# Patient Record
Sex: Female | Born: 1960 | Race: White | Hispanic: No | Marital: Married | Smoking: Never smoker
Health system: Southern US, Community
[De-identification: ages and names within clinical notes are randomized; demographics above are authoritative.]

## PROBLEM LIST (undated history)

## (undated) DIAGNOSIS — E039 Hypothyroidism, unspecified: Secondary | ICD-10-CM

## (undated) DIAGNOSIS — I471 Supraventricular tachycardia, unspecified: Secondary | ICD-10-CM

## (undated) DIAGNOSIS — J302 Other seasonal allergic rhinitis: Secondary | ICD-10-CM

## (undated) DIAGNOSIS — T7840XA Allergy, unspecified, initial encounter: Secondary | ICD-10-CM

## (undated) DIAGNOSIS — H269 Unspecified cataract: Secondary | ICD-10-CM

## (undated) HISTORY — DX: Supraventricular tachycardia: I47.1

## (undated) HISTORY — DX: Hypothyroidism, unspecified: E03.9

## (undated) HISTORY — DX: Other seasonal allergic rhinitis: J30.2

## (undated) HISTORY — DX: Unspecified cataract: H26.9

## (undated) HISTORY — DX: Allergy, unspecified, initial encounter: T78.40XA

## (undated) HISTORY — DX: Supraventricular tachycardia, unspecified: I47.10

---

## 1998-08-20 ENCOUNTER — Other Ambulatory Visit: Admission: RE | Admit: 1998-08-20 | Discharge: 1998-08-20 | Payer: Self-pay | Admitting: Obstetrics & Gynecology

## 1999-09-09 ENCOUNTER — Other Ambulatory Visit: Admission: RE | Admit: 1999-09-09 | Discharge: 1999-09-09 | Payer: Self-pay | Admitting: Obstetrics and Gynecology

## 2000-06-19 ENCOUNTER — Encounter: Payer: Self-pay | Admitting: Family Medicine

## 2000-06-19 ENCOUNTER — Encounter: Admission: RE | Admit: 2000-06-19 | Discharge: 2000-06-19 | Payer: Self-pay | Admitting: Family Medicine

## 2000-09-27 ENCOUNTER — Other Ambulatory Visit: Admission: RE | Admit: 2000-09-27 | Discharge: 2000-09-27 | Payer: Self-pay | Admitting: Obstetrics and Gynecology

## 2001-01-11 ENCOUNTER — Encounter: Payer: Self-pay | Admitting: Obstetrics and Gynecology

## 2001-01-11 ENCOUNTER — Encounter: Admission: RE | Admit: 2001-01-11 | Discharge: 2001-01-11 | Payer: Self-pay | Admitting: Obstetrics and Gynecology

## 2001-06-20 ENCOUNTER — Encounter: Payer: Self-pay | Admitting: Obstetrics and Gynecology

## 2001-06-20 ENCOUNTER — Ambulatory Visit (HOSPITAL_COMMUNITY): Admission: RE | Admit: 2001-06-20 | Discharge: 2001-06-20 | Payer: Self-pay | Admitting: Obstetrics and Gynecology

## 2001-10-04 ENCOUNTER — Other Ambulatory Visit: Admission: RE | Admit: 2001-10-04 | Discharge: 2001-10-04 | Payer: Self-pay | Admitting: Obstetrics and Gynecology

## 2002-07-05 ENCOUNTER — Ambulatory Visit (HOSPITAL_COMMUNITY): Admission: RE | Admit: 2002-07-05 | Discharge: 2002-07-05 | Payer: Self-pay | Admitting: Obstetrics and Gynecology

## 2002-07-05 ENCOUNTER — Encounter: Payer: Self-pay | Admitting: Obstetrics and Gynecology

## 2002-10-16 ENCOUNTER — Other Ambulatory Visit: Admission: RE | Admit: 2002-10-16 | Discharge: 2002-10-16 | Payer: Self-pay | Admitting: Obstetrics and Gynecology

## 2003-05-24 HISTORY — PX: OTHER SURGICAL HISTORY: SHX169

## 2003-07-16 ENCOUNTER — Ambulatory Visit (HOSPITAL_COMMUNITY): Admission: RE | Admit: 2003-07-16 | Discharge: 2003-07-16 | Payer: Self-pay | Admitting: Obstetrics and Gynecology

## 2003-11-06 ENCOUNTER — Other Ambulatory Visit: Admission: RE | Admit: 2003-11-06 | Discharge: 2003-11-06 | Payer: Self-pay | Admitting: Obstetrics and Gynecology

## 2004-09-13 ENCOUNTER — Ambulatory Visit (HOSPITAL_COMMUNITY): Admission: RE | Admit: 2004-09-13 | Discharge: 2004-09-13 | Payer: Self-pay | Admitting: Obstetrics and Gynecology

## 2004-12-09 ENCOUNTER — Other Ambulatory Visit: Admission: RE | Admit: 2004-12-09 | Discharge: 2004-12-09 | Payer: Self-pay | Admitting: Obstetrics and Gynecology

## 2005-01-18 ENCOUNTER — Ambulatory Visit: Payer: Self-pay | Admitting: Family Medicine

## 2005-01-25 ENCOUNTER — Ambulatory Visit: Payer: Self-pay | Admitting: Family Medicine

## 2005-02-22 ENCOUNTER — Encounter: Admission: RE | Admit: 2005-02-22 | Discharge: 2005-02-22 | Payer: Self-pay | Admitting: Family Medicine

## 2005-05-17 ENCOUNTER — Ambulatory Visit: Payer: Self-pay | Admitting: Family Medicine

## 2005-12-14 ENCOUNTER — Ambulatory Visit (HOSPITAL_COMMUNITY): Admission: RE | Admit: 2005-12-14 | Discharge: 2005-12-14 | Payer: Self-pay | Admitting: Family Medicine

## 2006-01-25 ENCOUNTER — Ambulatory Visit: Payer: Self-pay | Admitting: Family Medicine

## 2006-01-30 ENCOUNTER — Ambulatory Visit: Payer: Self-pay | Admitting: Family Medicine

## 2006-12-18 ENCOUNTER — Ambulatory Visit (HOSPITAL_COMMUNITY): Admission: RE | Admit: 2006-12-18 | Discharge: 2006-12-18 | Payer: Self-pay | Admitting: Obstetrics and Gynecology

## 2007-01-23 DIAGNOSIS — J309 Allergic rhinitis, unspecified: Secondary | ICD-10-CM | POA: Insufficient documentation

## 2007-01-23 DIAGNOSIS — E039 Hypothyroidism, unspecified: Secondary | ICD-10-CM | POA: Insufficient documentation

## 2007-02-19 ENCOUNTER — Ambulatory Visit: Payer: Self-pay | Admitting: Family Medicine

## 2007-02-19 LAB — CONVERTED CEMR LAB
Albumin: 4.2 g/dL (ref 3.5–5.2)
Alkaline Phosphatase: 34 units/L — ABNORMAL LOW (ref 39–117)
BUN: 15 mg/dL (ref 6–23)
Basophils Absolute: 0 10*3/uL (ref 0.0–0.1)
Cholesterol: 186 mg/dL (ref 0–200)
Eosinophils Absolute: 0.1 10*3/uL (ref 0.0–0.6)
GFR calc Af Amer: 99 mL/min
GFR calc non Af Amer: 82 mL/min
HCT: 41.1 % (ref 36.0–46.0)
HDL: 68.1 mg/dL (ref >39.0)
Hemoglobin: 14.2 g/dL (ref 12.0–15.0)
LDL Cholesterol: 105 mg/dL — ABNORMAL HIGH (ref 0–99)
MCHC: 34.5 g/dL (ref 30.0–36.0)
MCV: 94.9 fL (ref 78.0–100.0)
Monocytes Absolute: 0.3 10*3/uL (ref 0.2–0.7)
Monocytes Relative: 7.8 % (ref 3.0–11.0)
Neutro Abs: 2.3 10*3/uL (ref 1.4–7.7)
Neutrophils Relative %: 58.6 % (ref 43.0–77.0)
Potassium: 4.4 meq/L (ref 3.5–5.1)
Sodium: 145 meq/L (ref 135–145)
TSH: 1.13 microintl units/mL (ref 0.35–5.50)
Total Bilirubin: 1.1 mg/dL (ref 0.3–1.2)

## 2007-02-27 ENCOUNTER — Ambulatory Visit: Payer: Self-pay | Admitting: Family Medicine

## 2007-02-27 DIAGNOSIS — R599 Enlarged lymph nodes, unspecified: Secondary | ICD-10-CM | POA: Insufficient documentation

## 2007-03-26 DIAGNOSIS — K625 Hemorrhage of anus and rectum: Secondary | ICD-10-CM | POA: Insufficient documentation

## 2007-03-27 ENCOUNTER — Ambulatory Visit: Payer: Self-pay | Admitting: Family Medicine

## 2007-03-27 DIAGNOSIS — H6120 Impacted cerumen, unspecified ear: Secondary | ICD-10-CM | POA: Insufficient documentation

## 2008-01-04 ENCOUNTER — Ambulatory Visit (HOSPITAL_COMMUNITY): Admission: RE | Admit: 2008-01-04 | Discharge: 2008-01-04 | Payer: Self-pay | Admitting: Obstetrics and Gynecology

## 2008-03-14 ENCOUNTER — Ambulatory Visit: Payer: Self-pay | Admitting: Family Medicine

## 2008-03-14 LAB — CONVERTED CEMR LAB
Blood in Urine, dipstick: NEGATIVE
Glucose, Urine, Semiquant: NEGATIVE
Specific Gravity, Urine: 1.025
WBC Urine, dipstick: NEGATIVE
pH: 5.5

## 2008-03-18 ENCOUNTER — Telehealth: Payer: Self-pay | Admitting: *Deleted

## 2008-03-18 LAB — CONVERTED CEMR LAB
Albumin: 4.1 g/dL (ref 3.5–5.2)
BUN: 18 mg/dL (ref 6–23)
Basophils Relative: 0.5 % (ref 0.0–3.0)
Cholesterol: 177 mg/dL (ref 0–200)
Creatinine, Ser: 0.8 mg/dL (ref 0.4–1.2)
Eosinophils Absolute: 0.1 10*3/uL (ref 0.0–0.7)
Eosinophils Relative: 2.2 % (ref 0.0–5.0)
GFR calc Af Amer: 99 mL/min
GFR calc non Af Amer: 82 mL/min
HCT: 41.5 % (ref 36.0–46.0)
HDL: 79 mg/dL (ref >39.0)
MCHC: 34.5 g/dL (ref 30.0–36.0)
MCV: 95.8 fL (ref 78.0–100.0)
Monocytes Absolute: 0.3 10*3/uL (ref 0.1–1.0)
Platelets: 201 10*3/uL (ref 150–400)
RBC: 4.33 M/uL (ref 3.87–5.11)
TSH: 3.97 microintl units/mL (ref 0.35–5.50)
WBC: 4.1 10*3/uL — ABNORMAL LOW (ref 4.5–10.5)

## 2008-03-21 ENCOUNTER — Ambulatory Visit: Payer: Self-pay | Admitting: Family Medicine

## 2008-03-21 DIAGNOSIS — I8390 Asymptomatic varicose veins of unspecified lower extremity: Secondary | ICD-10-CM | POA: Insufficient documentation

## 2008-03-24 ENCOUNTER — Telehealth: Payer: Self-pay | Admitting: Family Medicine

## 2008-04-14 ENCOUNTER — Ambulatory Visit: Payer: Self-pay | Admitting: Vascular Surgery

## 2008-04-16 ENCOUNTER — Ambulatory Visit: Payer: Self-pay | Admitting: Internal Medicine

## 2008-05-19 ENCOUNTER — Telehealth: Payer: Self-pay | Admitting: Family Medicine

## 2008-05-29 ENCOUNTER — Ambulatory Visit: Payer: Self-pay | Admitting: Internal Medicine

## 2008-05-29 ENCOUNTER — Encounter: Payer: Self-pay | Admitting: Internal Medicine

## 2008-06-01 ENCOUNTER — Encounter: Payer: Self-pay | Admitting: Internal Medicine

## 2008-07-08 ENCOUNTER — Ambulatory Visit: Payer: Self-pay | Admitting: Vascular Surgery

## 2008-08-11 ENCOUNTER — Ambulatory Visit: Payer: Self-pay | Admitting: Vascular Surgery

## 2008-08-18 ENCOUNTER — Ambulatory Visit: Payer: Self-pay | Admitting: Vascular Surgery

## 2008-08-20 ENCOUNTER — Telehealth: Payer: Self-pay | Admitting: Family Medicine

## 2008-08-26 ENCOUNTER — Telehealth: Payer: Self-pay | Admitting: Family Medicine

## 2008-09-23 ENCOUNTER — Ambulatory Visit: Payer: Self-pay | Admitting: Vascular Surgery

## 2008-10-02 ENCOUNTER — Ambulatory Visit: Payer: Self-pay | Admitting: Vascular Surgery

## 2008-10-21 ENCOUNTER — Telehealth: Payer: Self-pay | Admitting: Family Medicine

## 2008-12-12 ENCOUNTER — Telehealth: Payer: Self-pay | Admitting: Family Medicine

## 2009-01-14 ENCOUNTER — Ambulatory Visit (HOSPITAL_COMMUNITY): Admission: RE | Admit: 2009-01-14 | Discharge: 2009-01-14 | Payer: Self-pay | Admitting: Obstetrics and Gynecology

## 2009-04-29 ENCOUNTER — Ambulatory Visit: Payer: Self-pay | Admitting: Family Medicine

## 2009-04-29 LAB — CONVERTED CEMR LAB
Albumin: 4.5 g/dL (ref 3.5–5.2)
BUN: 13 mg/dL (ref 6–23)
Basophils Relative: 0.1 % (ref 0.0–3.0)
Bilirubin Urine: NEGATIVE
Bilirubin, Direct: 0 mg/dL (ref 0.0–0.3)
CO2: 29 meq/L (ref 19–32)
Chloride: 106 meq/L (ref 96–112)
Cholesterol: 173 mg/dL (ref 0–200)
Creatinine, Ser: 0.8 mg/dL (ref 0.4–1.2)
Eosinophils Absolute: 0.1 10*3/uL (ref 0.0–0.7)
Glucose, Urine, Semiquant: NEGATIVE
Ketones, urine, test strip: NEGATIVE
MCHC: 33.7 g/dL (ref 30.0–36.0)
MCV: 97.2 fL (ref 78.0–100.0)
Monocytes Absolute: 0.3 10*3/uL (ref 0.1–1.0)
Neutro Abs: 3.5 10*3/uL (ref 1.4–7.7)
Neutrophils Relative %: 74.5 % (ref 43.0–77.0)
RBC: 4.32 M/uL (ref 3.87–5.11)
Specific Gravity, Urine: 1.02
TSH: 2.77 microintl units/mL (ref 0.35–5.50)
Total CHOL/HDL Ratio: 3
Total Protein: 6.9 g/dL (ref 6.0–8.3)
Triglycerides: 80 mg/dL (ref 0.0–149.0)

## 2009-05-08 ENCOUNTER — Ambulatory Visit: Payer: Self-pay | Admitting: Family Medicine

## 2009-05-08 DIAGNOSIS — Z86010 Personal history of colonic polyps: Secondary | ICD-10-CM | POA: Insufficient documentation

## 2010-01-20 ENCOUNTER — Ambulatory Visit (HOSPITAL_COMMUNITY): Admission: RE | Admit: 2010-01-20 | Discharge: 2010-01-20 | Payer: Self-pay | Admitting: Obstetrics and Gynecology

## 2010-05-23 HISTORY — PX: VEIN LIGATION: SHX2652

## 2010-05-27 ENCOUNTER — Ambulatory Visit: Admit: 2010-05-27 | Payer: Self-pay | Admitting: Family Medicine

## 2010-06-09 ENCOUNTER — Ambulatory Visit
Admission: RE | Admit: 2010-06-09 | Discharge: 2010-06-09 | Payer: Self-pay | Source: Home / Self Care | Attending: Family Medicine | Admitting: Family Medicine

## 2010-06-09 ENCOUNTER — Other Ambulatory Visit: Payer: Self-pay | Admitting: Family Medicine

## 2010-06-09 LAB — CBC WITH DIFFERENTIAL/PLATELET
Basophils Absolute: 0 10*3/uL (ref 0.0–0.1)
Basophils Relative: 0.6 % (ref 0.0–3.0)
Eosinophils Absolute: 0.1 10*3/uL (ref 0.0–0.7)
Eosinophils Relative: 2.7 % (ref 0.0–5.0)
HCT: 42 % (ref 36.0–46.0)
Hemoglobin: 14.4 g/dL (ref 12.0–15.0)
Lymphocytes Relative: 37.6 % (ref 12.0–46.0)
Lymphs Abs: 1.5 10*3/uL (ref 0.7–4.0)
MCHC: 34.3 g/dL (ref 30.0–36.0)
MCV: 95.2 fl (ref 78.0–100.0)
Monocytes Absolute: 0.3 10*3/uL (ref 0.1–1.0)
Monocytes Relative: 6.5 % (ref 3.0–12.0)
Neutro Abs: 2.1 10*3/uL (ref 1.4–7.7)
Neutrophils Relative %: 52.6 % (ref 43.0–77.0)
Platelets: 245 10*3/uL (ref 150.0–400.0)
RBC: 4.42 Mil/uL (ref 3.87–5.11)
RDW: 14.4 % (ref 11.5–14.6)
WBC: 4.1 10*3/uL — ABNORMAL LOW (ref 4.5–10.5)

## 2010-06-09 LAB — LIPID PANEL
Cholesterol: 186 mg/dL (ref 0–200)
HDL: 80.1 mg/dL (ref >39.00)
LDL Cholesterol: 96 mg/dL (ref 0–99)
Total CHOL/HDL Ratio: 2
Triglycerides: 51 mg/dL (ref 0.0–149.0)
VLDL: 10.2 mg/dL (ref 0.0–40.0)

## 2010-06-09 LAB — CONVERTED CEMR LAB
Bilirubin Urine: NEGATIVE
Glucose, Urine, Semiquant: NEGATIVE
Specific Gravity, Urine: 1.02
pH: 5

## 2010-06-09 LAB — BASIC METABOLIC PANEL
BUN: 13 mg/dL (ref 6–23)
CO2: 30 mEq/L (ref 19–32)
Calcium: 9.9 mg/dL (ref 8.4–10.5)
Chloride: 105 mEq/L (ref 96–112)
Creatinine, Ser: 0.7 mg/dL (ref 0.4–1.2)
GFR: 89.61 mL/min (ref >60.00)
Glucose, Bld: 88 mg/dL (ref 70–99)
Potassium: 4.2 mEq/L (ref 3.5–5.1)
Sodium: 142 mEq/L (ref 135–145)

## 2010-06-09 LAB — HEPATIC FUNCTION PANEL
ALT: 24 U/L (ref 0–35)
AST: 28 U/L (ref 0–37)
Albumin: 4.7 g/dL (ref 3.5–5.2)
Alkaline Phosphatase: 44 U/L (ref 39–117)
Bilirubin, Direct: 0.1 mg/dL (ref 0.0–0.3)
Total Bilirubin: 0.7 mg/dL (ref 0.3–1.2)
Total Protein: 7.2 g/dL (ref 6.0–8.3)

## 2010-06-09 LAB — TSH: TSH: 2.94 u[IU]/mL (ref 0.35–5.50)

## 2010-06-14 ENCOUNTER — Ambulatory Visit
Admission: RE | Admit: 2010-06-14 | Discharge: 2010-06-14 | Payer: Self-pay | Source: Home / Self Care | Attending: Family Medicine | Admitting: Family Medicine

## 2010-06-24 NOTE — Assessment & Plan Note (Signed)
Summary: CPX/CJR/pt rescd//ccm   Vital Signs:  Patient profile:   50 year old female Height:      65 inches Weight:      116 pounds BMI:     19.37 Temp:     98.1 degrees F oral BP sitting:   120 / 78  (left arm) Cuff size:   regular  Vitals Entered By: Kern Reap CMA Duncan Dull) (June 14, 2010 3:00 PM) CC: cpx Is Patient Diabetic? No Pain Assessment Patient in pain? no        CC:  cpx.  History of Present Illness: Jordan Anderson is a 50 year old, married female, nonsmoker, perimenopausal, who comes in today for general medical examination because of a history of hypothyroidism.  She takes Synthroid 75 micrograms daily TSH level normal.  Continue above dose.  She takes Claritin and steroid nasal spray p.r.n.  Tetanus 2009 colonoscopy due to thousand 15 mammogram September 2011 normal Pap by GYN.  July 2011 normal.  Allergies (verified): No Known Drug Allergies  Past History:  Past medical, surgical, family and social histories (including risk factors) reviewed, and no changes noted (except as noted below).  Past Medical History: Reviewed history from 03/27/2007 and no changes required. Allergic rhinitis Hypothyroidism endometrial ablation childbirth x 2  Past Surgical History: Reviewed history from 01/23/2007 and no changes required. CB X2  Family History: Reviewed history from 03/21/2008 and no changes required. Family History Hypertension colon polyps  Social History: Reviewed history from 02/27/2007 and no changes required. Married Never Smoked Alcohol use-no Drug use-no Regular exercise-yes  Review of Systems      See HPI  Physical Exam  General:  Well-developed,well-nourished,in no acute distress; alert,appropriate and cooperative throughout examination Head:  Normocephalic and atraumatic without obvious abnormalities. No apparent alopecia or balding. Eyes:  No corneal or conjunctival inflammation noted. EOMI. Perrla. Funduscopic exam benign, without  hemorrhages, exudates or papilledema. Vision grossly normal. Ears:  External ear exam shows no significant lesions or deformities.  Otoscopic examination reveals clear canals, tympanic membranes are intact bilaterally without bulging, retraction, inflammation or discharge. Hearing is grossly normal bilaterally. Nose:  External nasal examination shows no deformity or inflammation. Nasal mucosa are pink and moist without lesions or exudates. Mouth:  Oral mucosa and oropharynx without lesions or exudates.  Teeth in good repair. Neck:  No deformities, masses, or tenderness noted. Chest Wall:  No deformities, masses, or tenderness noted. Breasts:  No mass, nodules, thickening, tenderness, bulging, retraction, inflamation, nipple discharge or skin changes noted.   Lungs:  Normal respiratory effort, chest expands symmetrically. Lungs are clear to auscultation, no crackles or wheezes. Heart:  Normal rate and regular rhythm. S1 and S2 normal without gallop, murmur, click, rub or other extra sounds. Abdomen:  Bowel sounds positive,abdomen soft and non-tender without masses, organomegaly or hernias noted. Msk:  No deformity or scoliosis noted of thoracic or lumbar spine.   Pulses:  R and L carotid,radial,femoral,dorsalis pedis and posterior tibial pulses are full and equal bilaterally Extremities:  No clubbing, cyanosis, edema, or deformity noted with normal full range of motion of all joints.   Neurologic:  No cranial nerve deficits noted. Station and gait are normal. Plantar reflexes are down-going bilaterally. DTRs are symmetrical throughout. Sensory, motor and coordinative functions appear intact. Skin:  Intact without suspicious lesions or rashes Cervical Nodes:  No lymphadenopathy noted Axillary Nodes:  No palpable lymphadenopathy Inguinal Nodes:  No significant adenopathy Psych:  Cognition and judgment appear intact. Alert and cooperative with normal attention span and  concentration. No apparent  delusions, illusions, hallucinations   Impression & Recommendations:  Problem # 1:  HEALTH SCREENING (ICD-V70.0) Assessment Unchanged  Orders: Prescription Created Electronically [phone])  Problem # 2:  HYPOTHYROIDISM (ICD-244.9) Assessment: Improved  Her updated medication list for this problem includes:    Synthroid 75 Mcg Tabs (Levothyroxine sodium) .Marland Kitchen... Take 1 tablet by mouth once a day  name brand only  Orders: Prescription Created Electronically [id number])  Complete Medication List: 1)  Synthroid 75 Mcg Tabs (Levothyroxine sodium) .... Take 1 tablet by mouth once a day  name brand only 2)  Claritin 10 Mg Tabs (Loratadine) .... As needed 3)  Nasonex 50 Mcg/act Susp (Mometasone furoate) .... As needed 4)  Ferrous Sulfate 300 (60 Fe) Mg/63ml Syrp (Ferrous sulfate) .... Take 1 tablet by mouth at bedtime 5)  Motrin  .Marland Kitchen.. 3 tabs two times a day  Patient Instructions: 1)  Please schedule a follow-up appointment in 1 year. 2)  Take calcium +Vitamin D daily. Prescriptions: SYNTHROID 75 MCG  TABS (LEVOTHYROXINE SODIUM) Take 1 tablet by mouth once a day  name brand only  #100 x 3   Entered and Authorized by:   Roderick Pee MD   Signed by:   Roderick Pee MD on 06/14/2010   Method used:   Electronically to        [facility]. [phone]* (retail)       [phone]       [place], [place]  [address]       Ph: [phone]       Fax: [phone]   RxID:   [id number] NASONEX 50 MCG/ACT  SUSP (MOMETASONE FUROATE) as needed  #3 x 4   Entered and Authorized by:   Roderick Pee MD   Signed by:   Roderick Pee MD on 06/14/2010   Method used:   Print then Give to Patient   RxID:   [id number] SYNTHROID 75 MCG  TABS (LEVOTHYROXINE SODIUM) Take 1 tablet by mouth once a day  name brand only  #100 x 3   Entered and Authorized by:   Roderick Pee MD   Signed by:   Roderick Pee MD on 06/14/2010   Method used:   Print then Give to Patient    RxID:   [id number] SYNTHROID 75 MCG  TABS (LEVOTHYROXINE SODIUM) Take 1 tablet by mouth once a day  name brand only  #100 x 3   Entered and Authorized by:   Roderick Pee MD   Signed by:   Roderick Pee MD on 06/14/2010   Method used:   Electronically to        [facility]. [phone]* (retail)       [phone]       [place], [place]  [address]       Ph: [phone]       Fax: [phone]   RxID:   [id number]    Orders Added: 1)  Prescription Created Electronically [[id number]] 2)  Est. Patient 40-64 years [phone]

## 2010-07-28 ENCOUNTER — Telehealth: Payer: Self-pay | Admitting: Family Medicine

## 2010-07-28 NOTE — Telephone Encounter (Signed)
Pt was given a Rx for synthroid but it was signed on the line for generic... However, the pt can't take generic and has to have brand name...Marland KitchenMarland Kitchen Pt req that refill rx for Synthroid be prepared (written / paper Rx) and he will take care of getting it filled.... 90-day supply... pts # [phone].

## 2010-07-29 MED ORDER — SYNTHROID 75 MCG PO TABS: 75.0000 ug | ORAL_TABLET | Freq: Every day | ORAL | Status: DC

## 2010-10-05 NOTE — Assessment & Plan Note (Signed)
OFFICE VISIT   Jordan, Anderson  DOB:  [birth year]                                       08/18/2008  AOZHY#:[id number]   The patient returns today 1 week post laser ablation of her left great  saphenous vein with multiple stab phlebectomies.  She has had some mild  discomfort in the left thigh since her procedure with some aching near  the ablation site.  She had some moderate ecchymosis throughout the  midportion of the left thigh.  She had no distal edema and no pain at  the stab phlebectomy sites in the calf and distal thigh.   On exam today, blood pressure is 108/70, heart rate 68.  There is no  distal edema in the left leg.  There is moderate ecchymosis and bruising  in the left mid thigh which is mildly tender.  Stab wounds are all  healed nicely.  Venous duplex exam reveals no evidence of deep venous  obstruction total and occlusion left greater saphenous vein from the  ablation site to the saphenofemoral junction.   She was reassured regarding these findings.  She will wear her elastic  stocking one more week and return to see [place] on a p.r.n. basis.   Quita Skye Hart [place], M.D.  Electronically Signed   JDL/MEDQ  D:  08/18/2008  T:  08/19/2008  Job:  [id number]   cc:   Tinnie Gens A. Tawanna Cooler, MD

## 2010-10-05 NOTE — Procedures (Signed)
DUPLEX DEEP VENOUS EXAM - LOWER EXTREMITY   INDICATION:  Follow up left greater saphenous vein ablation.   HISTORY:  Edema:  Yes.  Trauma/Surgery:  Yes.  Pain:  Mild.  PE:  No.  Previous DVT:  No.  Anticoagulants:  Other:   DUPLEX EXAM:                CFV   SFV   PopV  PTV    GSV                R  L  R  L  R  L  R   L  R  L  Thrombosis    o  o     o     o      o     +  Spontaneous   +  +     +     +      +     0  Phasic        +  +     +     +      +     0  Augmentation  +  +     +     +      +     0  Compressible  +  +     +     +      +     0  Competent     +  +     +     +      +     0   Legend:  + - yes  o - no  p - partial  D - decreased   IMPRESSION:  1. No evidence of deep venous thrombosis noted in the left leg.  2. The left greater saphenous vein appears ablated from the      saphenofemoral junction to the distal thigh.    _____________________________  Quita Skye. Hart [place], M.D.   MG/MEDQ  D:  08/18/2008  T:  08/18/2008  Job:  [id number]

## 2010-10-05 NOTE — Assessment & Plan Note (Signed)
OFFICE VISIT   Jordan Anderson, Jordan Anderson  DOB:  June 27, 1960                                       07/08/2008  ZOXWR#:[id number]   The patient returns today for further evaluation of her severe venous  insufficiency of the left leg.  She has painful varicosities in her mid  thigh and mid to posterior calf area which has been present for many  years and are becoming increasingly symptomatic.  She has throbbing,  burning, aching and stinging discomfort which is aggravated by the more  she is on her feet.  She has tried long leg elastic compression  stockings as well as ibuprofen and elevation over the last 3 months with  no improvement in her symptomatology.  It is definitely affecting her  daily living and ability to work around the house and be active.   She has documented gross reflux of the left saphenofemoral junction in  the great saphenous vein which connects with these painful varicosities  and would be a good candidate for laser ablation of the left great  saphenous vein with multiple stab phlebectomies.  I think we should  proceed with this to relieve the symptoms in this female with painful  varicosities.   Quita Skye Hart [place], M.D.  Electronically Signed   JDL/MEDQ  D:  07/08/2008  T:  07/09/2008  Job:  [id number]

## 2010-10-05 NOTE — Consult Note (Signed)
VASCULAR SURGERY CONSULTATION   Jordan Anderson, Jordan Anderson  DOB:  Aug 06, 1960                                       04/14/2008  ZOXWR#:[id number]   The patient was referred for vascular surgery consultation for venous  insufficiency by Dr. Alonza Smoker.  This healthy 50 year old female has an  enlarging nest of varicosities in her left mid thigh and mid and  posterior calf area which have been present over the last 15-20 years  but have been gradually enlarging and becoming more symptomatic.  She  now describes a throbbing, burning, aching pain, as if the leg is  expanding the more she is on her feet.  This is not relieved by pain  medication.  Elevation does help somewhat, although she is unable to do  that during the day on a regular basis.  She has had no bleeding,  ulceration, thrombophlebitis or deep venous thrombosis, but does note  swelling in the ankle and foot as the day progresses.  She has not worn  elastic compression stockings.   PAST MEDICAL HISTORY:  Negative for diabetes, hypertension, coronary  artery disease, CVA, hyperlipidemia, COPD or stroke.   PREVIOUS SURGERY:  Endometrial ablation.   FAMILY HISTORY:  Positive for varicose veins in her father, coronary  artery disease and stroke in a grandparent.   SOCIAL HISTORY:  She is married, has 2 children.  She does not use  tobacco and drinks occasional alcohol.   REVIEW OF SYSTEMS:  Totally unremarkable.   ALLERGIES:  None known.   MEDICATIONS:  Synthroid 0.075 one daily.   PHYSICAL EXAM:  Blood pressure 111/80, heart rate 69, respirations 14.  General:  She is a healthy-appearing middle-aged female in no apparent  distress, alert and oriented x3.  Neck:  Supple, 3+ carotid pulses  palpable.  No bruits are audible.  Neurologic:  Normal.  No palpable  adenopathy in the neck.  Chest:  Clear to auscultation.  Cardiovascular:  Regular rhythm, no murmurs.  Abdomen:  Soft, nontender with no palpable  masses.   Lower Extremity:  Reveals 3+ femoral, popliteal, dorsalis pedis  and posterior tibial pulses palpable bilaterally.  Both feet are well-  perfused with no evidence of arterial insufficiency.  Left leg has  severe venous insufficiency along the medial aspect of the thigh  extending down into the medial calf posteriorly with moderate to large  varicosities.  She has some mild edema distally.  There is no  hyperpigmentation or ulceration in the left leg.  Right leg is  unremarkable.  Venous duplex exam performed in our office today reveals  gross reflux at the left saphenofemoral junction and throughout the left  great saphenous vein with tapering in size in the mid thigh.  The deep  system is normal with no reflux.  Small saphenous vein on the left also  has no reflux.   I think she does have symptomatic venous insufficiency of the left leg  secondary to gross reflux of left great saphenous vein and  saphenofemoral junction.  We Jordan treat her with long-leg graduated  elastic compression stockings (20 mm - 30 mm gradient) as well as  elevation as much as possible and analgesics on a regular basis.  She  Jordan try ibuprofen.  She Jordan return in 3 months to see if she has  obtained symptomatic relief.  If  not, I think she would be a good  candidate for laser ablation of her left great saphenous vein with  multiple stab phlebectomies to treat these varicosities which are  affecting her daily living and are quite symptomatic.   Quita Skye Hart [place], M.D.  Electronically Signed  JDL/MEDQ  D:  04/14/2008  T:  04/15/2008  Job:  [id number]

## 2010-10-05 NOTE — Procedures (Signed)
LOWER EXTREMITY VENOUS REFLUX EXAM   INDICATION:  Bilateral lower extremity varicose veins, left worse than  right.   EXAM:  Using color-flow imaging and pulse Doppler spectral analysis, the  right and left common femoral, superficial femoral, popliteal, posterior  tibial, greater and lesser saphenous veins are evaluated.  There is no  evidence suggesting deep venous insufficiency in the right or left lower  extremity.   The left saphenofemoral junction is not competent.  The left GSV is not  competent with the caliber as described below.   The left proximal short saphenous vein demonstrates incompetency.   GSV Diameter (used if found to be incompetent only)                                            Right    Left  Proximal Greater Saphenous Vein           cm       1.0 cm  Proximal-to-mid-thigh                     cm       0.6 cm  Mid thigh                                 cm       0.3 cm  Mid-distal thigh                          cm       0.3 cm  Distal thigh                              cm       0.3 cm  Knee                                      cm       0.4 cm   IMPRESSION:  1. Left greater saphenous vein reflux is identified with the caliber      ranging from 0.3 cm to 1.0 cm knee to groin.  2. The right and left greater saphenous veins are not aneurysmal.  3. The right and left greater saphenous veins are not tortuous.  4. The deep venous system is competent.  5. The left lesser saphenous vein is not competent.   ___________________________________________  Quita Skye. Hart [place], M.D.   MC/MEDQ  D:  04/14/2008  T:  04/14/2008  Job:  [id number]

## 2010-10-22 ENCOUNTER — Ambulatory Visit (INDEPENDENT_AMBULATORY_CARE_PROVIDER_SITE_OTHER): Payer: BC Managed Care – PPO | Admitting: Family Medicine

## 2010-10-22 ENCOUNTER — Encounter: Payer: Self-pay | Admitting: Family Medicine

## 2010-10-22 VITALS — BP 100/68 | Temp 99.2°F | Ht 65.5 in | Wt 214.0 lb

## 2010-10-22 DIAGNOSIS — J329 Chronic sinusitis, unspecified: Secondary | ICD-10-CM

## 2010-10-22 MED ORDER — AZITHROMYCIN 250 MG PO TABS: ORAL_TABLET | ORAL | Status: AC

## 2010-10-22 NOTE — Progress Notes (Signed)
  Subjective:    Patient ID: Jordan Anderson, female    DOB: 1960/10/22, 50 y.o.   MRN: [id number]  HPI Here for 3 days of sinus pressure, PND, ST, and a dry cough. No fever. On Nuprin.    Review of Systems  Constitutional: Negative.   HENT: Positive for congestion, postnasal drip and sinus pressure.   Eyes: Negative.   Respiratory: Positive for cough.        Objective:   Physical Exam  Constitutional: She appears well-developed and well-nourished.  HENT:  Right Ear: External ear normal.  Left Ear: External ear normal.  Nose: Nose normal.  Mouth/Throat: Oropharynx is clear and moist. No oropharyngeal exudate.  Eyes: Conjunctivae are normal. Pupils are equal, round, and reactive to light.  Neck: No thyromegaly present.  Pulmonary/Chest: Effort normal and breath sounds normal. No respiratory distress. She has no wheezes. She has no rales. She exhibits no tenderness.  Lymphadenopathy:    She has no cervical adenopathy.          Assessment & Plan:  Rest, fluids

## 2011-01-11 ENCOUNTER — Other Ambulatory Visit (HOSPITAL_COMMUNITY): Payer: Self-pay | Admitting: Obstetrics and Gynecology

## 2011-01-11 DIAGNOSIS — Z1231 Encounter for screening mammogram for malignant neoplasm of breast: Secondary | ICD-10-CM

## 2011-01-26 ENCOUNTER — Ambulatory Visit (HOSPITAL_COMMUNITY)
Admission: RE | Admit: 2011-01-26 | Discharge: 2011-01-26 | Disposition: A | Discharge status: Home / Self Care | Payer: BC Managed Care – PPO | Source: Ambulatory Visit | Attending: Obstetrics and Gynecology | Admitting: Obstetrics and Gynecology

## 2011-01-26 DIAGNOSIS — Z1231 Encounter for screening mammogram for malignant neoplasm of breast: Secondary | ICD-10-CM | POA: Insufficient documentation

## 2011-05-27 ENCOUNTER — Ambulatory Visit (INDEPENDENT_AMBULATORY_CARE_PROVIDER_SITE_OTHER): Payer: BC Managed Care – PPO | Admitting: Internal Medicine

## 2011-05-27 DIAGNOSIS — J019 Acute sinusitis, unspecified: Secondary | ICD-10-CM | POA: Insufficient documentation

## 2011-05-27 MED ORDER — CEFUROXIME AXETIL 500 MG PO TABS: 500.0000 mg | ORAL_TABLET | Freq: Two times a day (BID) | ORAL | Status: AC

## 2011-05-27 NOTE — Assessment & Plan Note (Signed)
51 year old white female with signs and symptoms of acute sinusitis. Treat with cefuroxime 500 mg twice a day. Also treated with nasal saline as directed.  Patient advised to call office if symptoms persist or worsen.

## 2011-05-27 NOTE — Progress Notes (Signed)
  Subjective:    Patient ID: Jordan Anderson, female    DOB: 02-22-1961, 51 y.o.   MRN: [id number]  URI  This is a new problem. The current episode started in the past 7 days. The problem has been gradually worsening. The maximum temperature recorded prior to her arrival was 101 - 101.9 F. The fever has been present for 1 to 2 days. Associated symptoms include congestion, coughing and sinus pain. Pertinent negatives include no ear pain or neck pain. She has tried decongestant for the symptoms. The treatment provided no relief.   She reports yellowish sputum.   Review of Systems  HENT: Positive for congestion. Negative for ear pain and neck pain.   Respiratory: Positive for cough.        No past medical history on file.  History   Social History  . Marital Status: Married    Spouse Name: N/A    Number of Children: N/A  . Years of Education: N/A   Occupational History  . Not on file.   Social History Main Topics  . Smoking status: Never Smoker   . Smokeless tobacco: Not on file  . Alcohol Use:   . Drug Use:   . Sexually Active:    Other Topics Concern  . Not on file   Social History Narrative  . No narrative on file    No past surgical history on file.  No family history on file.  Not on File  Current Outpatient Prescriptions on File Prior to Visit  Medication Sig Dispense Refill  . SYNTHROID 75 MCG tablet Take 1 tablet (75 mcg total) by mouth daily.  100 tablet  3    BP 102/80  Temp(Src) 101.8 F (38.8 C) (Oral)  Wt 115 lb (52.164 kg)    Objective:   Physical Exam  Constitutional: She appears well-developed and well-nourished.  HENT:  Head: Normocephalic and atraumatic.  Right Ear: External ear normal.  Left Ear: External ear normal.  Mouth/Throat: No oropharyngeal exudate.       Oropharyngeal erythema  Neck: Neck supple.  Cardiovascular: Normal rate, regular rhythm and normal heart sounds.   Pulmonary/Chest: Effort normal. No respiratory  distress. She has no wheezes.  Lymphadenopathy:    She has no cervical adenopathy.  Skin: Skin is warm and dry.       Assessment & Plan:

## 2011-05-27 NOTE — Patient Instructions (Signed)
Use nasal saline as directed Use mucinex and gargle with warm salt water Please call our office if your symptoms do not improve or gets worse.

## 2011-07-15 ENCOUNTER — Encounter: Payer: Self-pay | Admitting: Family

## 2011-07-15 ENCOUNTER — Ambulatory Visit (INDEPENDENT_AMBULATORY_CARE_PROVIDER_SITE_OTHER): Payer: BC Managed Care – PPO | Admitting: Family

## 2011-07-15 VITALS — BP 104/80 | Temp 98.8°F | Ht 65.5 in | Wt 116.0 lb

## 2011-07-15 DIAGNOSIS — J309 Allergic rhinitis, unspecified: Secondary | ICD-10-CM

## 2011-07-15 DIAGNOSIS — J040 Acute laryngitis: Secondary | ICD-10-CM

## 2011-07-15 DIAGNOSIS — R059 Cough, unspecified: Secondary | ICD-10-CM

## 2011-07-15 DIAGNOSIS — R05 Cough: Secondary | ICD-10-CM

## 2011-07-15 MED ORDER — METHYLPREDNISOLONE ACETATE 80 MG/ML IJ SUSP
80.0000 mg | Freq: Once | INTRAMUSCULAR | Status: AC
  Administered 2011-07-15: 80 mg via INTRAMUSCULAR

## 2011-07-15 MED ORDER — FEXOFENADINE-PSEUDOEPHED ER 180-240 MG PO TB24: 1.0000 | ORAL_TABLET | Freq: Every day | ORAL | Status: AC

## 2011-07-15 MED ORDER — METHYLPREDNISOLONE ACETATE 80 MG/ML IJ SUSP: 80.0000 mg | Freq: Once | INTRAMUSCULAR | Status: DC

## 2011-07-15 NOTE — Progress Notes (Signed)
  Subjective:    Patient ID: Jordan Anderson, female    DOB: May 14, 1961, 51 y.o.   MRN: [id number]  HPI Comments: 51 year old white female, nonsmoker, patient of Dr. Tawanna Cooler his in today complaining of sinus drainage, sore throat and hoarseness have been going on for 2 days. She's been taken over-the-counter Sudafed with little to no relief. She denies any muscle aches or pain, no fever, lightheadedness, dizziness, chest pain, palpitations, nausea or vomiting.     Review of Systems  Constitutional: Negative.   HENT: Positive for congestion, sore throat, voice change and postnasal drip.   Eyes: Negative.   Respiratory: Positive for cough.   Cardiovascular: Negative.   Musculoskeletal: Negative.   Skin: Negative.   Neurological: Negative.   Hematological: Negative.        Objective:   Physical Exam  Constitutional: She is oriented to person, place, and time. She appears well-developed and well-nourished.  HENT:  Right Ear: External ear normal.  Left Ear: External ear normal.  Nose: Nose normal.       Pharynx slightly red  Neck: Normal range of motion. Neck supple.  Cardiovascular: Normal rate, regular rhythm and normal heart sounds.   Pulmonary/Chest: Effort normal and breath sounds normal.  Musculoskeletal: Normal range of motion.  Neurological: She is alert and oriented to person, place, and time.  Skin: Skin is warm and dry.  Psychiatric: She has a normal mood and affect.          Assessment & Plan:  Assessment: Laryngitis, allergic rhinitis, cough  Plan: Depo-Medrol 80 mg IM x1 given. Allegra D 24 hours once daily given. Rest. Drink plenty of fluids. Call the office if symptoms worsen or persist. Recheck a schedule, and when necessary.

## 2011-07-18 ENCOUNTER — Ambulatory Visit (INDEPENDENT_AMBULATORY_CARE_PROVIDER_SITE_OTHER): Payer: BC Managed Care – PPO | Admitting: Family Medicine

## 2011-07-18 ENCOUNTER — Encounter: Payer: Self-pay | Admitting: Family Medicine

## 2011-07-18 VITALS — BP 124/78 | Temp 98.1°F

## 2011-07-18 DIAGNOSIS — J069 Acute upper respiratory infection, unspecified: Secondary | ICD-10-CM | POA: Insufficient documentation

## 2011-07-18 MED ORDER — PREDNISONE 20 MG PO TABS: ORAL_TABLET | ORAL | Status: DC

## 2011-07-18 MED ORDER — HYDROCODONE-HOMATROPINE 5-1.5 MG/5ML PO SYRP: ORAL_SOLUTION | ORAL | Status: DC

## 2011-07-18 NOTE — Progress Notes (Signed)
  Subjective:    Patient ID: Jordan Anderson, female    DOB: November 17, 1960, 51 y.o.   MRN: [id number]  HPI [name] is a 51 year old married female nonsmoker who comes in today with a six-day history of head congestion sore throat postnasal drip and cough. No fever no sputum production.  She was seen here last Friday by the nurse practitioner and given a shot of steroids Review of Systems    general and ENT and pulmonary review of systems otherwise negative Objective:   Physical Exam  Well-developed well-nourished female in no acute distress HEENT negative neck was supple no adenopathy lungs are clear except for some faint late expiratory wheezing      Assessment & Plan:  Viral syndrome with reactive airway disease plan short course of oral prednisone Hydromet return when necessary

## 2011-07-18 NOTE — Patient Instructions (Signed)
Drink lots of water  Do not take any over-the-counter medications,,,,,,, no Sudafed  Vaporizer in your bedroom at night  Hydromet 1/2-1 teaspoon at bedtime when necessary  Take the prednisone as directed  Return when necessary

## 2011-08-07 ENCOUNTER — Other Ambulatory Visit: Payer: Self-pay | Admitting: Internal Medicine

## 2011-08-08 NOTE — Telephone Encounter (Signed)
Dr Todd pt. 

## 2011-11-30 ENCOUNTER — Ambulatory Visit (INDEPENDENT_AMBULATORY_CARE_PROVIDER_SITE_OTHER): Payer: BC Managed Care – PPO | Admitting: Family

## 2011-11-30 ENCOUNTER — Encounter: Payer: Self-pay | Admitting: Family

## 2011-11-30 VITALS — BP 110/78 | HR 84 | Temp 98.4°F | Wt 114.0 lb

## 2011-11-30 DIAGNOSIS — J019 Acute sinusitis, unspecified: Secondary | ICD-10-CM

## 2011-11-30 DIAGNOSIS — R0982 Postnasal drip: Secondary | ICD-10-CM

## 2011-11-30 MED ORDER — METHYLPREDNISOLONE 4 MG PO KIT: PACK | ORAL | Status: AC

## 2011-11-30 NOTE — Patient Instructions (Addendum)

## 2011-11-30 NOTE — Progress Notes (Signed)
  Subjective:    Patient ID: Jordan Anderson, female    DOB: [birth year], 51 y.o.   MRN: [id number]  Sinusitis This is a new problem. The current episode started in the past 7 days. The problem is unchanged. There has been no fever. Associated symptoms include congestion and a sore throat. Pertinent negatives include no ear pain, sinus pressure or sneezing. Past treatments include nothing.      Review of Systems  Constitutional: Negative.   HENT: Positive for congestion, sore throat, rhinorrhea and postnasal drip. Negative for ear pain, sneezing and sinus pressure.   Respiratory: Negative.   Cardiovascular: Negative.   Musculoskeletal: Negative.   Neurological: Negative.   Hematological: Negative.   Psychiatric/Behavioral: Negative.        Objective:   Physical Exam  Constitutional: She is oriented to person, place, and time. She appears well-developed and well-nourished.  HENT:  Right Ear: External ear normal.  Left Ear: External ear normal.  Mouth/Throat: Oropharynx is clear and moist.  Neck: Normal range of motion. Neck supple.  Cardiovascular: Normal rate, regular rhythm and normal heart sounds.   Pulmonary/Chest: Effort normal and breath sounds normal.  Musculoskeletal: Normal range of motion.  Neurological: She is alert and oriented to person, place, and time.  Skin: Skin is warm and dry.  Psychiatric: She has a normal mood and affect.          Assessment & Plan:  Assessment: Postnasal drip, sinusitis  Plan: Medrol Dosepak as directed. Rest. Drink plenty of fluids. Patient call the office symptoms worsen or persist. Recheck a schedule, and when necessary. ring.

## 2012-01-19 ENCOUNTER — Other Ambulatory Visit (HOSPITAL_COMMUNITY): Payer: Self-pay | Admitting: Obstetrics and Gynecology

## 2012-01-19 DIAGNOSIS — Z1231 Encounter for screening mammogram for malignant neoplasm of breast: Secondary | ICD-10-CM

## 2012-01-30 ENCOUNTER — Ambulatory Visit (HOSPITAL_COMMUNITY)
Admission: RE | Admit: 2012-01-30 | Discharge: 2012-01-30 | Disposition: A | Discharge status: Home / Self Care | Payer: BC Managed Care – PPO | Source: Ambulatory Visit | Attending: Obstetrics and Gynecology | Admitting: Obstetrics and Gynecology

## 2012-01-30 DIAGNOSIS — Z1231 Encounter for screening mammogram for malignant neoplasm of breast: Secondary | ICD-10-CM | POA: Insufficient documentation

## 2012-02-06 ENCOUNTER — Other Ambulatory Visit (INDEPENDENT_AMBULATORY_CARE_PROVIDER_SITE_OTHER): Payer: BC Managed Care – PPO

## 2012-02-06 DIAGNOSIS — Z13220 Encounter for screening for lipoid disorders: Secondary | ICD-10-CM

## 2012-02-06 DIAGNOSIS — Z0000 Encounter for general adult medical examination without abnormal findings: Secondary | ICD-10-CM

## 2012-02-06 LAB — LIPID PANEL
HDL: 84.8 mg/dL (ref >39.00)
Total CHOL/HDL Ratio: 2

## 2012-02-06 LAB — POCT URINALYSIS DIPSTICK
Ketones, UA: NEGATIVE
Leukocytes, UA: NEGATIVE
Nitrite, UA: NEGATIVE
Protein, UA: NEGATIVE
Urobilinogen, UA: 0.2
pH, UA: 7.5

## 2012-02-06 LAB — HEPATIC FUNCTION PANEL
AST: 26 U/L (ref 0–37)
Albumin: 4.5 g/dL (ref 3.5–5.2)
Alkaline Phosphatase: 38 U/L — ABNORMAL LOW (ref 39–117)
Bilirubin, Direct: 0.1 mg/dL (ref 0.0–0.3)
Total Protein: 7.3 g/dL (ref 6.0–8.3)

## 2012-02-06 LAB — CBC WITH DIFFERENTIAL/PLATELET
Basophils Absolute: 0 10*3/uL (ref 0.0–0.1)
Basophils Relative: 0.4 % (ref 0.0–3.0)
Hemoglobin: 13.9 g/dL (ref 12.0–15.0)
Lymphocytes Relative: 32.7 % (ref 12.0–46.0)
Monocytes Relative: 8.6 % (ref 3.0–12.0)
Neutro Abs: 2 10*3/uL (ref 1.4–7.7)
Neutrophils Relative %: 56.2 % (ref 43.0–77.0)
RBC: 4.4 Mil/uL (ref 3.87–5.11)

## 2012-02-06 LAB — LDL CHOLESTEROL, DIRECT: Direct LDL: 102.2 mg/dL

## 2012-02-06 LAB — BASIC METABOLIC PANEL
Calcium: 9.6 mg/dL (ref 8.4–10.5)
GFR: 83.7 mL/min (ref >60.00)
Sodium: 141 mEq/L (ref 135–145)

## 2012-02-09 ENCOUNTER — Encounter: Payer: BC Managed Care – PPO | Admitting: Family Medicine

## 2012-02-13 ENCOUNTER — Ambulatory Visit (INDEPENDENT_AMBULATORY_CARE_PROVIDER_SITE_OTHER): Payer: BC Managed Care – PPO | Admitting: Family Medicine

## 2012-02-13 ENCOUNTER — Encounter: Payer: Self-pay | Admitting: Family Medicine

## 2012-02-13 VITALS — BP 102/68 | Temp 98.5°F | Ht 66.0 in | Wt 117.0 lb

## 2012-02-13 DIAGNOSIS — E039 Hypothyroidism, unspecified: Secondary | ICD-10-CM

## 2012-02-13 DIAGNOSIS — Z780 Asymptomatic menopausal state: Secondary | ICD-10-CM

## 2012-02-13 DIAGNOSIS — J309 Allergic rhinitis, unspecified: Secondary | ICD-10-CM

## 2012-02-13 DIAGNOSIS — H6120 Impacted cerumen, unspecified ear: Secondary | ICD-10-CM

## 2012-02-13 MED ORDER — ESTROGENS, CONJUGATED 0.625 MG/GM VA CREA: TOPICAL_CREAM | Freq: Every day | VAGINAL | Status: DC

## 2012-02-13 MED ORDER — SYNTHROID 75 MCG PO TABS: 75.0000 ug | ORAL_TABLET | Freq: Every day | ORAL | Status: DC

## 2012-02-13 NOTE — Patient Instructions (Signed)
Continue the Synthroid one daily  Small amounts of hormonal cream twice weekly  Remember to do a thorough breast exam monthly  Return in one year sooner if any problems  Stretching Motrin and elevation and ice as needed for your heel pain

## 2012-02-13 NOTE — Progress Notes (Signed)
  Subjective:    Patient ID: Jordan Anderson, female    DOB: 01/22/1961, 51 y.o.   MRN: [id number]  HPI Jordan Anderson is a 51 year old married female nonsmoker who comes in today for general physical examination because of a history of allergic rhinitis and hypothyroidism.  She takes over-the-counter Allegra for allergic rhinitis and Synthroid 75 mcg daily for hypothyroidism  5 years ago she had an endometrial ablation. She did have some perimenopausal symptoms a couple years ago but they have basically gone away.. She does have vaginal dryness.  She gets routine eye care, dental care, BSE monthly, and you mammography, colonoscopy at age 51 because of colon polyps. Tetanus booster 2009 she declines a flu shot    Review of Systems  Constitutional: Negative.   HENT: Negative.   Eyes: Negative.   Respiratory: Negative.   Cardiovascular: Negative.   Gastrointestinal: Negative.   Genitourinary: Negative.   Musculoskeletal: Negative.   Neurological: Negative.   Hematological: Negative.   Psychiatric/Behavioral: Negative.        Objective:   Physical Exam  Constitutional: She appears well-developed and well-nourished.  HENT:  Head: Normocephalic and atraumatic.  Right Ear: External ear normal.  Left Ear: External ear normal.  Nose: Nose normal.  Mouth/Throat: Oropharynx is clear and moist.  Eyes: EOM are normal. Pupils are equal, round, and reactive to light.  Neck: Normal range of motion. Neck supple. No thyromegaly present.  Cardiovascular: Normal rate, regular rhythm, normal heart sounds and intact distal pulses.  Exam reveals no gallop and no friction rub.   No murmur heard. Pulmonary/Chest: Effort normal and breath sounds normal.  Abdominal: Soft. Bowel sounds are normal. She exhibits no distension and no mass. There is no tenderness. There is no rebound.  Genitourinary:       Bilateral breast exam normal  Musculoskeletal: Normal range of motion.  Lymphadenopathy:    She has no  cervical adenopathy.  Neurological: She is alert. She has normal reflexes. No cranial nerve deficit. She exhibits normal muscle tone. Coordination normal.  Skin: Skin is warm and dry.       Total body skin exam normal  Psychiatric: She has a normal mood and affect. Her behavior is normal. Judgment and thought content normal.          Assessment & Plan:  Healthy female  Hypothyroidism continue Synthroid  Allergic rhinitis continue Allegra  Postmenopausal vaginal dryness add Premarin cream  Achilles tendinitis Motrin elevation and ice

## 2012-03-14 ENCOUNTER — Other Ambulatory Visit: Payer: Self-pay | Admitting: *Deleted

## 2012-03-14 MED ORDER — FLUTICASONE PROPIONATE 50 MCG/ACT NA SUSP: 2.0000 | Freq: Every day | NASAL | Status: DC

## 2012-05-04 ENCOUNTER — Other Ambulatory Visit: Payer: Self-pay | Admitting: Family Medicine

## 2012-12-17 ENCOUNTER — Other Ambulatory Visit: Payer: Self-pay | Admitting: Family Medicine

## 2012-12-17 DIAGNOSIS — Z1231 Encounter for screening mammogram for malignant neoplasm of breast: Secondary | ICD-10-CM

## 2013-02-01 ENCOUNTER — Ambulatory Visit (HOSPITAL_COMMUNITY)
Admission: RE | Admit: 2013-02-01 | Discharge: 2013-02-01 | Disposition: A | Discharge status: Home / Self Care | Payer: BC Managed Care – PPO | Source: Ambulatory Visit | Attending: Family Medicine | Admitting: Family Medicine

## 2013-02-01 ENCOUNTER — Other Ambulatory Visit: Payer: Self-pay | Admitting: Family Medicine

## 2013-02-01 DIAGNOSIS — Z1231 Encounter for screening mammogram for malignant neoplasm of breast: Secondary | ICD-10-CM

## 2013-02-13 ENCOUNTER — Other Ambulatory Visit: Payer: BC Managed Care – PPO

## 2013-02-19 ENCOUNTER — Encounter: Payer: BC Managed Care – PPO | Admitting: Family Medicine

## 2013-03-27 ENCOUNTER — Other Ambulatory Visit (INDEPENDENT_AMBULATORY_CARE_PROVIDER_SITE_OTHER): Payer: BC Managed Care – PPO

## 2013-03-27 DIAGNOSIS — Z0000 Encounter for general adult medical examination without abnormal findings: Secondary | ICD-10-CM

## 2013-03-27 LAB — POCT URINALYSIS DIPSTICK
Bilirubin, UA: NEGATIVE
Blood, UA: NEGATIVE
Glucose, UA: NEGATIVE
Ketones, UA: NEGATIVE
Spec Grav, UA: 1.015

## 2013-03-27 LAB — LIPID PANEL
Cholesterol: 196 mg/dL (ref 0–200)
LDL Cholesterol: 115 mg/dL — ABNORMAL HIGH (ref 0–99)
Triglycerides: 43 mg/dL (ref 0.0–149.0)

## 2013-03-27 LAB — HEPATIC FUNCTION PANEL
ALT: 22 U/L (ref 0–35)
AST: 28 U/L (ref 0–37)
Alkaline Phosphatase: 39 U/L (ref 39–117)
Bilirubin, Direct: 0 mg/dL (ref 0.0–0.3)
Total Bilirubin: 0.6 mg/dL (ref 0.3–1.2)

## 2013-03-27 LAB — CBC WITH DIFFERENTIAL/PLATELET
Basophils Absolute: 0 10*3/uL (ref 0.0–0.1)
Eosinophils Absolute: 0.1 10*3/uL (ref 0.0–0.7)
Eosinophils Relative: 2.2 % (ref 0.0–5.0)
MCV: 94.3 fl (ref 78.0–100.0)
Monocytes Absolute: 0.3 10*3/uL (ref 0.1–1.0)
Neutrophils Relative %: 55.5 % (ref 43.0–77.0)
Platelets: 216 10*3/uL (ref 150.0–400.0)
WBC: 3.7 10*3/uL — ABNORMAL LOW (ref 4.5–10.5)

## 2013-03-27 LAB — BASIC METABOLIC PANEL
BUN: 22 mg/dL (ref 6–23)
Chloride: 103 mEq/L (ref 96–112)
Creatinine, Ser: 0.8 mg/dL (ref 0.4–1.2)

## 2013-04-04 ENCOUNTER — Encounter: Payer: Self-pay | Admitting: Family Medicine

## 2013-04-04 ENCOUNTER — Ambulatory Visit (INDEPENDENT_AMBULATORY_CARE_PROVIDER_SITE_OTHER): Payer: BC Managed Care – PPO | Admitting: Family Medicine

## 2013-04-04 VITALS — BP 110/70 | Temp 98.1°F | Ht 65.25 in | Wt 115.0 lb

## 2013-04-04 DIAGNOSIS — N952 Postmenopausal atrophic vaginitis: Secondary | ICD-10-CM | POA: Insufficient documentation

## 2013-04-04 DIAGNOSIS — J309 Allergic rhinitis, unspecified: Secondary | ICD-10-CM

## 2013-04-04 DIAGNOSIS — Z780 Asymptomatic menopausal state: Secondary | ICD-10-CM

## 2013-04-04 DIAGNOSIS — E039 Hypothyroidism, unspecified: Secondary | ICD-10-CM

## 2013-04-04 MED ORDER — SYNTHROID 75 MCG PO TABS: 75.0000 ug | ORAL_TABLET | Freq: Every day | ORAL | Status: DC

## 2013-04-04 MED ORDER — ESTROGENS, CONJUGATED 0.625 MG/GM VA CREA: TOPICAL_CREAM | Freq: Every day | VAGINAL | Status: DC

## 2013-04-04 MED ORDER — FLUTICASONE PROPIONATE 50 MCG/ACT NA SUSP: NASAL | Status: DC

## 2013-04-04 NOTE — Patient Instructions (Signed)
Continue the Synthroid one daily  Continue steroid nasal spray and Zyrtec 10 mg plain each bedtime   use small amounts of the hormonal cream 3 times weekly  Return in one year sooner if any problems

## 2013-04-04 NOTE — Progress Notes (Signed)
  Subjective:    Patient ID: Jordan Anderson, female    DOB: 1960/09/24, 52 y.o.   MRN: [id number]  HPI Jordan Anderson is a 52 year old married female nonsmoker who comes in today for general physical examination  She had 8 endometrial ablation about 8 years ago. Since that time she's had no periods about a year ago she was having some fairly modest hot flashes now they seem to have diminished. She does have dryness of her vagina and was given some hormonal cream which she's not used yet.  She takes Synthroid 75 mcg for hypothyroidism  She is to Flonase nasal spray for allergic rhinitis was having a lot of symptoms we'll add a plain Zyrtec  She gets routine eye care, dental care, BSE monthly, and you mammography, colonoscopy and GI, vaccinations up-to-date   Review of Systems  Constitutional: Negative.   HENT: Negative.   Eyes: Negative.   Respiratory: Negative.   Cardiovascular: Negative.   Gastrointestinal: Negative.   Endocrine: Negative.   Genitourinary: Negative.   Musculoskeletal: Negative.   Allergic/Immunologic: Negative.   Neurological: Negative.   Hematological: Negative.   Psychiatric/Behavioral: Negative.        Objective:   Physical Exam  Constitutional: She appears well-developed and well-nourished.  HENT:  Head: Normocephalic and atraumatic.  Right Ear: External ear normal.  Left Ear: External ear normal.  Nose: Nose normal.  Mouth/Throat: Oropharynx is clear and moist.  Eyes: EOM are normal. Pupils are equal, round, and reactive to light.  Neck: Normal range of motion. Neck supple. No thyromegaly present.  Cardiovascular: Normal rate, regular rhythm, normal heart sounds and intact distal pulses.  Exam reveals no gallop and no friction rub.   No murmur heard. Pulmonary/Chest: Effort normal and breath sounds normal.  Abdominal: Soft. Bowel sounds are normal. She exhibits no distension and no mass. There is no tenderness. There is no rebound.  Genitourinary:   Bilateral breast exam normal she's got history of dense breasts therefore recommend 3-D mammography in the future.  Pap year and a half ago recommended every 3 years since she is otherwise healthy and 0 risk factors  Musculoskeletal: Normal range of motion.  Lymphadenopathy:    She has no cervical adenopathy.  Neurological: She is alert. She has normal reflexes. No cranial nerve deficit. She exhibits normal muscle tone. Coordination normal.  Skin: Skin is warm and dry.  Psychiatric: She has a normal mood and affect. Her behavior is normal. Judgment and thought content normal.          Assessment & Plan:  Healthy female  Hypothyroidism continue Synthroid  Allergic rhinitis continue steroid nasal spray and OTC Zyrtec plain  Postmenopausal vaginal dryness Premarin vaginal cream

## 2013-04-22 ENCOUNTER — Ambulatory Visit (INDEPENDENT_AMBULATORY_CARE_PROVIDER_SITE_OTHER)
Admission: RE | Admit: 2013-04-22 | Discharge: 2013-04-22 | Disposition: A | Discharge status: Home / Self Care | Payer: BC Managed Care – PPO | Source: Ambulatory Visit | Attending: Family Medicine | Admitting: Family Medicine

## 2013-04-22 DIAGNOSIS — N952 Postmenopausal atrophic vaginitis: Secondary | ICD-10-CM

## 2013-04-22 DIAGNOSIS — Z780 Asymptomatic menopausal state: Secondary | ICD-10-CM

## 2013-05-01 ENCOUNTER — Encounter: Payer: Self-pay | Admitting: Internal Medicine

## 2013-05-22 ENCOUNTER — Encounter: Payer: Self-pay | Admitting: Internal Medicine

## 2013-07-12 ENCOUNTER — Encounter: Payer: Self-pay | Admitting: Internal Medicine

## 2013-07-12 ENCOUNTER — Ambulatory Visit (AMBULATORY_SURGERY_CENTER): Payer: Self-pay | Admitting: *Deleted

## 2013-07-12 VITALS — Ht 65.5 in | Wt 118.4 lb

## 2013-07-12 DIAGNOSIS — Z86010 Personal history of colonic polyps: Secondary | ICD-10-CM

## 2013-07-12 MED ORDER — MOVIPREP 100 G PO SOLR: ORAL | Status: DC

## 2013-07-12 NOTE — Progress Notes (Signed)
No allergies to eggs or soy. No problems with anesthesia.  

## 2013-07-26 ENCOUNTER — Encounter: Payer: Self-pay | Admitting: Internal Medicine

## 2013-07-26 ENCOUNTER — Ambulatory Visit (AMBULATORY_SURGERY_CENTER): Payer: BC Managed Care – PPO | Admitting: Internal Medicine

## 2013-07-26 VITALS — BP 105/75 | HR 53 | Temp 97.2°F | Resp 20 | Ht 65.5 in | Wt 118.0 lb

## 2013-07-26 DIAGNOSIS — D126 Benign neoplasm of colon, unspecified: Secondary | ICD-10-CM

## 2013-07-26 DIAGNOSIS — Z86010 Personal history of colonic polyps: Secondary | ICD-10-CM

## 2013-07-26 MED ORDER — SODIUM CHLORIDE 0.9 % IV SOLN: 500.0000 mL | INTRAVENOUS | Status: DC

## 2013-07-26 NOTE — Patient Instructions (Signed)

## 2013-07-26 NOTE — Progress Notes (Signed)
Called to room to assist during endoscopic procedure.  Patient ID and intended procedure confirmed with present staff. Received instructions for my participation in the procedure from the performing physician.  

## 2013-07-26 NOTE — Op Note (Signed)
[facility]  [address] [place], [address]   COLONOSCOPY PROCEDURE REPORT  PATIENT: Jordan Anderson, Jordan Anderson  MR#: [id number] BIRTHDATE: [birth year] , 52  yrs. old GENDER: Female ENDOSCOPIST: Lafayette Dragon, MD REFERRED IZ:[id number] Delora Fuel, M.D. PROCEDURE DATE:  07/26/2013 PROCEDURE:   Colonoscopy with cold biopsy polypectomy and Colonoscopy with snare polypectomy First Screening Colonoscopy - Avg.  risk and is 50 yrs.  old or older - No.  Prior Negative Screening - Now for repeat screening. N/A  History of Adenoma - Now for follow-up colonoscopy & has been > or = to 3 yrs.  Yes hx of adenoma.  Has been 3 or more years since last colonoscopy.  Polyps Removed Today? Yes. ASA CLASS:   Class I INDICATIONS:colonoscopy in January 2010 showed adenomatous polyp. MEDICATIONS: MAC sedation, administered by CRNA and propofol (Diprivan) 350mg  IV  DESCRIPTION OF PROCEDURE:   After the risks benefits and alternatives of the procedure were thoroughly explained, informed consent was obtained.  A digital rectal exam revealed no abnormalities of the rectum.   The LB [id number]-[id number]  endoscope was introduced through the anus and advanced to the cecum, which was identified by both the appendix and ileocecal valve. No adverse events experienced.   The quality of the prep was good, using MoviPrep  The instrument was then slowly withdrawn as the colon was fully examined.      COLON FINDINGS: Two semi-pedunculated polyps ranging between 5-13mm in size were found in the descending colon at 50  and sigmoid colon.at 20 cm.  A polypectomy was performed with cold forceps ( 50 cm) and with a cold snare.( at 20 cm)  The resection was complete and the polyp tissue was completely retrieved.  Retroflexed views revealed no abnormalities. The time to cecum=4 minutes 59 seconds. Withdrawal time=15 minutes 29 seconds.  The scope was withdrawn and the procedure completed. COMPLICATIONS: There were  no complications.  ENDOSCOPIC IMPRESSION: Two semi-pedunculated polyps ranging between 5-46mm in size were found in the descending colon and sigmoid colon; polypectomy was performed with cold forceps and with a cold snare  RECOMMENDATIONS: 1.  Await pathology results 2.  no aspirin or NSAIDs for 2 weeks High-fiber diet Recall colonoscopy pending path report   eSigned:  Lafayette Dragon, MD 07/26/2013 10:29 AM   cc:   PATIENT NAME:  Jordan Anderson, Jordan Anderson MR#: [id number]

## 2013-07-26 NOTE — Progress Notes (Signed)
Report to pacu rn, vss, bbs=clear 

## 2013-07-29 ENCOUNTER — Telehealth: Payer: Self-pay

## 2013-07-29 NOTE — Telephone Encounter (Signed)
Left a message at # [phone] for the pt to call if any questions or concerns. Maw

## 2013-07-30 ENCOUNTER — Encounter: Payer: Self-pay | Admitting: Internal Medicine

## 2013-08-01 ENCOUNTER — Encounter: Payer: Self-pay | Admitting: *Deleted

## 2013-09-23 ENCOUNTER — Ambulatory Visit (INDEPENDENT_AMBULATORY_CARE_PROVIDER_SITE_OTHER): Payer: BC Managed Care – PPO | Admitting: Family Medicine

## 2013-09-23 ENCOUNTER — Encounter: Payer: Self-pay | Admitting: Family Medicine

## 2013-09-23 VITALS — BP 140/90 | Temp 98.4°F | Wt 118.0 lb

## 2013-09-23 DIAGNOSIS — J309 Allergic rhinitis, unspecified: Secondary | ICD-10-CM

## 2013-09-23 MED ORDER — MONTELUKAST SODIUM 10 MG PO TABS: 10.0000 mg | ORAL_TABLET | Freq: Every day | ORAL | Status: DC

## 2013-09-23 NOTE — Progress Notes (Signed)
Pre visit review using our clinic review tool, if applicable. No additional management support is needed unless otherwise documented below in the visit note. 

## 2013-09-23 NOTE — Progress Notes (Signed)
   Subjective:    Patient ID: Jordan Anderson, female    DOB: 1960/06/08, 53 y.o.   MRN: [id number]  HPI [name] is a 53 year old married female nonsmoker who comes in today for a flare of her allergic rhinitis  She takes plain Zyrtec daily along with a shot of steroid nasal spray up each nostril. She plays tennis about every day. Last Thursday her symptoms became worse. She has a lot of head congestion postnasal drip. Coughing but no wheezing. No history of asthma   Review of Systems    negative review of systems Objective:   Physical Exam Well-developed well-nourished female no acute distress vital signs stable she is afebrile HEENT negative except for 3+ nasal edema neck was supple no adenopathy lungs are clear       Assessment & Plan:  Allergic rhinitis........... add Singulair.Marland Kitchen

## 2013-09-23 NOTE — Patient Instructions (Signed)
Zyrtec 10 mg plain......Marland Kitchen 1 at bedtime  Afrin nasal spray.......... 5 night limit  Steroid nasal spray  Singulair 10 mg one at bedtime

## 2013-10-08 ENCOUNTER — Ambulatory Visit (INDEPENDENT_AMBULATORY_CARE_PROVIDER_SITE_OTHER): Payer: BC Managed Care – PPO | Admitting: Family Medicine

## 2013-10-08 ENCOUNTER — Encounter: Payer: Self-pay | Admitting: Family Medicine

## 2013-10-08 VITALS — BP 110/70 | Temp 97.6°F | Wt 114.0 lb

## 2013-10-08 DIAGNOSIS — H6120 Impacted cerumen, unspecified ear: Secondary | ICD-10-CM

## 2013-10-08 NOTE — Progress Notes (Signed)
   Subjective:    Patient ID: Jordan Anderson, female    DOB: 1961-02-10, 53 y.o.   MRN: [id number]  HPI Jordan Anderson is a 53 year old female comes in today for removal of cerumen impaction in her right ear  She tried the home irrigation but it didn't work   Review of Systems Negative except for hearing loss secondary to ear wax    Objective:   Physical Exam  Well-developed well-nourished female no acute distress vital signs stable she is afebrile examination left ear normal right ear completely occluded  After irrigation wax removed no complications      Assessment & Plan:

## 2013-10-08 NOTE — Progress Notes (Signed)
Pre visit review using our clinic review tool, if applicable. No additional management support is needed unless otherwise documented below in the visit note. 

## 2013-10-08 NOTE — Patient Instructions (Signed)
Return when necessary 

## 2013-12-05 ENCOUNTER — Other Ambulatory Visit: Payer: Self-pay | Admitting: Obstetrics and Gynecology

## 2013-12-06 LAB — CYTOLOGY - PAP

## 2014-01-17 ENCOUNTER — Other Ambulatory Visit: Payer: Self-pay | Admitting: Family Medicine

## 2014-01-17 DIAGNOSIS — Z1231 Encounter for screening mammogram for malignant neoplasm of breast: Secondary | ICD-10-CM

## 2014-02-03 ENCOUNTER — Ambulatory Visit (HOSPITAL_COMMUNITY)
Admission: RE | Admit: 2014-02-03 | Discharge: 2014-02-03 | Disposition: A | Discharge status: Home / Self Care | Payer: BC Managed Care – PPO | Source: Ambulatory Visit | Attending: Family Medicine | Admitting: Family Medicine

## 2014-02-03 DIAGNOSIS — Z1231 Encounter for screening mammogram for malignant neoplasm of breast: Secondary | ICD-10-CM | POA: Diagnosis not present

## 2014-04-15 ENCOUNTER — Telehealth: Payer: Self-pay | Admitting: Family Medicine

## 2014-04-15 DIAGNOSIS — E039 Hypothyroidism, unspecified: Secondary | ICD-10-CM

## 2014-04-15 MED ORDER — SYNTHROID 75 MCG PO TABS: 75.0000 ug | ORAL_TABLET | Freq: Every day | ORAL | Status: DC

## 2014-04-15 NOTE — Telephone Encounter (Signed)
Pt has made a cpe for 12/3, but will be out of her meds in 2 days. Can you send rx SYNTHROID 75 MCG tablet [facility] [address] [facility]

## 2014-04-17 ENCOUNTER — Other Ambulatory Visit: Payer: Self-pay | Admitting: Family Medicine

## 2014-04-21 ENCOUNTER — Other Ambulatory Visit (INDEPENDENT_AMBULATORY_CARE_PROVIDER_SITE_OTHER): Payer: BC Managed Care – PPO

## 2014-04-21 DIAGNOSIS — Z0000 Encounter for general adult medical examination without abnormal findings: Secondary | ICD-10-CM

## 2014-04-21 LAB — LIPID PANEL
Cholesterol: 189 mg/dL (ref 0–200)
HDL: 68 mg/dL (ref >39.00)
LDL Cholesterol: 112 mg/dL — ABNORMAL HIGH (ref 0–99)
NonHDL: 121
Total CHOL/HDL Ratio: 3
Triglycerides: 44 mg/dL (ref 0.0–149.0)
VLDL: 8.8 mg/dL (ref 0.0–40.0)

## 2014-04-21 LAB — BASIC METABOLIC PANEL
BUN: 18 mg/dL (ref 6–23)
CHLORIDE: 105 meq/L (ref 96–112)
CO2: 24 mEq/L (ref 19–32)
CREATININE: 0.8 mg/dL (ref 0.4–1.2)
Calcium: 9.2 mg/dL (ref 8.4–10.5)
GFR: 83 mL/min (ref >60.00)
Glucose, Bld: 91 mg/dL (ref 70–99)
Potassium: 4.1 mEq/L (ref 3.5–5.1)
Sodium: 138 mEq/L (ref 135–145)

## 2014-04-21 LAB — CBC WITH DIFFERENTIAL/PLATELET
BASOS ABS: 0 10*3/uL (ref 0.0–0.1)
Basophils Relative: 0.6 % (ref 0.0–3.0)
Eosinophils Absolute: 0.1 10*3/uL (ref 0.0–0.7)
Eosinophils Relative: 1.7 % (ref 0.0–5.0)
HCT: 42 % (ref 36.0–46.0)
Hemoglobin: 14 g/dL (ref 12.0–15.0)
Lymphocytes Relative: 31.1 % (ref 12.0–46.0)
Lymphs Abs: 1.1 10*3/uL (ref 0.7–4.0)
MCHC: 33.4 g/dL (ref 30.0–36.0)
MCV: 94.7 fl (ref 78.0–100.0)
MONO ABS: 0.2 10*3/uL (ref 0.1–1.0)
Monocytes Relative: 6.7 % (ref 3.0–12.0)
Neutro Abs: 2.1 10*3/uL (ref 1.4–7.7)
Neutrophils Relative %: 59.9 % (ref 43.0–77.0)
PLATELETS: 211 10*3/uL (ref 150.0–400.0)
RBC: 4.43 Mil/uL (ref 3.87–5.11)
RDW: 14.1 % (ref 11.5–15.5)
WBC: 3.5 10*3/uL — ABNORMAL LOW (ref 4.0–10.5)

## 2014-04-21 LAB — HEPATIC FUNCTION PANEL
ALT: 17 U/L (ref 0–35)
AST: 24 U/L (ref 0–37)
Albumin: 4.6 g/dL (ref 3.5–5.2)
Alkaline Phosphatase: 41 U/L (ref 39–117)
BILIRUBIN DIRECT: 0.1 mg/dL (ref 0.0–0.3)
BILIRUBIN TOTAL: 0.6 mg/dL (ref 0.2–1.2)
TOTAL PROTEIN: 6.8 g/dL (ref 6.0–8.3)

## 2014-04-21 LAB — POCT URINALYSIS DIPSTICK
BILIRUBIN UA: NEGATIVE
GLUCOSE UA: NEGATIVE
Ketones, UA: NEGATIVE
LEUKOCYTES UA: NEGATIVE
NITRITE UA: NEGATIVE
Protein, UA: NEGATIVE
RBC UA: NEGATIVE
Spec Grav, UA: 1.02
Urobilinogen, UA: 0.2
pH, UA: 5.5

## 2014-04-21 LAB — TSH: TSH: 2.27 u[IU]/mL (ref 0.35–4.50)

## 2014-04-24 ENCOUNTER — Encounter: Payer: Self-pay | Admitting: Family Medicine

## 2014-04-24 ENCOUNTER — Ambulatory Visit (INDEPENDENT_AMBULATORY_CARE_PROVIDER_SITE_OTHER): Payer: BC Managed Care – PPO | Admitting: Family Medicine

## 2014-04-24 VITALS — BP 110/70 | Temp 98.2°F | Ht 65.25 in | Wt 116.0 lb

## 2014-04-24 DIAGNOSIS — Z780 Asymptomatic menopausal state: Secondary | ICD-10-CM

## 2014-04-24 DIAGNOSIS — Z86010 Personal history of colonic polyps: Secondary | ICD-10-CM

## 2014-04-24 DIAGNOSIS — N952 Postmenopausal atrophic vaginitis: Secondary | ICD-10-CM

## 2014-04-24 DIAGNOSIS — E039 Hypothyroidism, unspecified: Secondary | ICD-10-CM

## 2014-04-24 DIAGNOSIS — Z0000 Encounter for general adult medical examination without abnormal findings: Secondary | ICD-10-CM | POA: Insufficient documentation

## 2014-04-24 MED ORDER — SYNTHROID 75 MCG PO TABS: 75.0000 ug | ORAL_TABLET | Freq: Every day | ORAL | Status: DC

## 2014-04-24 MED ORDER — ESTROGENS, CONJUGATED 0.625 MG/GM VA CREA: TOPICAL_CREAM | Freq: Every day | VAGINAL | Status: DC

## 2014-04-24 NOTE — Progress Notes (Signed)
   Subjective:    Patient ID: Jordan Anderson, female    DOB: [birth year], 53 y.o.   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..... Nonsmoker....... Who comes in today for general physical examination  She takes Synthroid 75 g daily because of a history of hypothyroidism  She uses Premarin crit cream twice weekly for vaginal dryness. She uses Flonase and Singulair when necessary for allergic rhinitis  She gets routine eye care, dental care, BSE monthly, annual mammography...Marland KitchenMarland KitchenMarland Kitchen 3 day because of dense breasts........ She's actually had 2 colonoscopies. Family history of colon polyps. Last colonoscopy in the spring showed 2 polyps as did her first colonoscopy  She's had an endometrial ablation therefore has no periods.  Declines a flu shot   Review of Systems  Constitutional: Negative.   HENT: Negative.   Eyes: Negative.   Respiratory: Negative.   Cardiovascular: Negative.   Gastrointestinal: Negative.   Endocrine: Negative.   Genitourinary: Negative.   Musculoskeletal: Negative.   Skin: Negative.   Allergic/Immunologic: Negative.   Neurological: Negative.   Hematological: Negative.   Psychiatric/Behavioral: Negative.        Objective:   Physical Exam  Constitutional: She appears well-developed and well-nourished.  HENT:  Head: Normocephalic and atraumatic.  Right Ear: External ear normal.  Left Ear: External ear normal.  Nose: Nose normal.  Mouth/Throat: Oropharynx is clear and moist.  Eyes: EOM are normal. Pupils are equal, round, and reactive to light.  Neck: Normal range of motion. Neck supple. No JVD present. No tracheal deviation present. No thyromegaly present.  Cardiovascular: Normal rate, regular rhythm, normal heart sounds and intact distal pulses.  Exam reveals no gallop and no friction rub.   No murmur heard. Pulmonary/Chest: Effort normal and breath sounds normal. No stridor. No  respiratory distress. She has no wheezes. She has no rales. She exhibits no tenderness.  Abdominal: Soft. Bowel sounds are normal. She exhibits no distension and no mass. There is no tenderness. There is no rebound and no guarding.  Genitourinary:  Bilateral breast exam normal  Musculoskeletal: Normal range of motion.  Lymphadenopathy:    She has no cervical adenopathy.  Neurological: She is alert. She has normal reflexes. No cranial nerve deficit. She exhibits normal muscle tone. Coordination normal.  Skin: Skin is warm and dry. No rash noted. No erythema. No pallor.  Total body skin exam normal  Psychiatric: She has a normal mood and affect. Her behavior is normal. Judgment and thought content normal.          Assessment & Plan:  Healthy female  Hypothyroidism continue Synthroid  Allergic rhinitis steroid nasal spray and Singulair when necessary  History of colon polyps,,,,, 2 on her first exam to on a second exam,,,,,, follow-up with GI as outlined

## 2014-04-24 NOTE — Patient Instructions (Signed)
Continue your current medications  Follow-up in one year sooner if any problem

## 2014-04-24 NOTE — Progress Notes (Signed)
Pre visit review using our clinic review tool, if applicable. No additional management support is needed unless otherwise documented below in the visit note. 

## 2014-05-22 ENCOUNTER — Ambulatory Visit (INDEPENDENT_AMBULATORY_CARE_PROVIDER_SITE_OTHER): Payer: BC Managed Care – PPO | Admitting: Family Medicine

## 2014-05-22 ENCOUNTER — Encounter: Payer: Self-pay | Admitting: Family Medicine

## 2014-05-22 VITALS — BP 110/80 | Temp 98.1°F | Wt 111.0 lb

## 2014-05-22 DIAGNOSIS — J069 Acute upper respiratory infection, unspecified: Secondary | ICD-10-CM

## 2014-05-22 MED ORDER — HYDROCODONE-HOMATROPINE 5-1.5 MG/5ML PO SYRP: ORAL_SOLUTION | ORAL | Status: DC

## 2014-05-22 NOTE — Progress Notes (Signed)
   Subjective:    Patient ID: Jordan Anderson, female    DOB: 1961-04-09, 53 y.o.   MRN: [id number]  HPI Jordan Anderson is a 53 year old married female nonsmoker who comes in with a 2 day history of fever chills myalgias  Body temperatures up to 100.6. No earache sore throat cough nausea vomiting diarrhea urinary tract symptoms    Review of Systems Review of systems otherwise negative    Objective:   Physical Exam  Well-developed well-nourished female no acute distress vital signs stable she's afebrile HEENT were negative neck was supple no adenopathy lungs are clear      Assessment & Plan:  Viral syndrome plan treat symptomatically return.

## 2014-05-22 NOTE — Progress Notes (Signed)
Pre visit review using our clinic review tool, if applicable. No additional management support is needed unless otherwise documented below in the visit note. 

## 2014-05-22 NOTE — Patient Instructions (Signed)
Tylenol.. Or aspirin,,,,,,,,,,, 2 tabs 3 times daily when necessary  Hydromet.. 1/2-1 teaspoon at bedtime when necessary

## 2014-05-27 ENCOUNTER — Telehealth: Payer: Self-pay | Admitting: Family Medicine

## 2014-05-28 ENCOUNTER — Ambulatory Visit (INDEPENDENT_AMBULATORY_CARE_PROVIDER_SITE_OTHER): Payer: BLUE CROSS/BLUE SHIELD | Admitting: Family Medicine

## 2014-05-28 ENCOUNTER — Encounter: Payer: Self-pay | Admitting: Family Medicine

## 2014-05-28 VITALS — BP 110/70 | Temp 98.1°F | Wt 114.0 lb

## 2014-05-28 DIAGNOSIS — J069 Acute upper respiratory infection, unspecified: Secondary | ICD-10-CM

## 2014-05-28 NOTE — Patient Instructions (Signed)
Drink lots water  At bedtime Afrin nasal spray,,,,,,,,, followed by steroid nasal spray,,,,, 1 shot up each nostril,,,,, the Afrin has a 5 night limit

## 2014-05-28 NOTE — Telephone Encounter (Signed)
PLEASE NOTE: All timestamps contained within this report are represented as [place] Standard Time. CONFIDENTIALTY NOTICE: This fax transmission is intended only for the addressee. It contains information that is legally privileged, confidential or otherwise protected from use or disclosure. If you are not the intended recipient, you are strictly prohibited from reviewing, disclosing, copying using or disseminating any of this information or taking any action in reliance on or regarding this information. If you have received this fax in error, please notify us immediately by telephone so that we can arrange for its return to [place]. Phone: [phone], Toll-Free: [phone], Fax: [phone] Page: 1 of 2 Call Id: [id number] [facility] - [facility] Patient Name: Jordan Anderson Gender: Female DOB: 05-21-1961 Age: 54 Y 2 M 11 D Return Phone Number: [phone] (Primary), [phone] (Secondary) Address: [address] City/State/Zip: [place] [address] Client [facility] - Client Client Site [facility] - Day Physician [place], [place] Type Call Call Type Triage / Clinical Relationship To Patient Self Appointment Disposition EMR Appointment Scheduled Return Phone Number [phone] (Secondary) Chief Complaint Fever (non urgent symptom) Initial Comment Caller states they have a fever of 99.4 PreDisposition Did not know what to do Nurse Assessment Nurse: Loletta Specter, RN, Wells Guiles Date/Time (Eastern Time): 05/27/2014 3:18:09 PM Confirm and document reason for call. If symptomatic, describe symptoms. ---Caller states they have a fever of 99.4 since last tues and seen in office thurs. Temp has been over 100 several times. Diagnosed with viral infection. Headache, backpain, gone now, Has the patient traveled out of the country within the last 30 days? ---Not  Applicable Does the patient require triage? ---Yes Related visit to physician within the last 2 weeks? ---No Does the PT have any chronic conditions? (i.e. diabetes, asthma, etc.) ---No Did the patient indicate they were pregnant? ---No Guidelines Guideline Title Affirmed Question Affirmed Notes Nurse Date/Time (Eastern Time) Fever Fever present > 3 days (72 hours) Loletta Specter, RN, Wells Guiles 05/27/2014 3:20:23 PM Disp. Time Eilene Ghazi Time) Disposition Final User 05/27/2014 3:24:51 PM See Physician within 24 Hours Yes Loletta Specter, RN, Romualdo Bolk Understands: Yes Disagree/Comply: Comply Care Advice Given Per Guideline PLEASE NOTE: All timestamps contained within this report are represented as [place] Standard Time. CONFIDENTIALTY NOTICE: This fax transmission is intended only for the addressee. It contains information that is legally privileged, confidential or otherwise protected from use or disclosure. If you are not the intended recipient, you are strictly prohibited from reviewing, disclosing, copying using or disseminating any of this information or taking any action in reliance on or regarding this information. If you have received this fax in error, please notify us immediately by telephone so that we can arrange for its return to [place]. Phone: [phone], Toll-Free: [phone], Fax: [phone] Page: 2 of 2 Call Id: [phone] Care Advice Given Per Guideline SEE PHYSICIAN WITHIN 24 HOURS: * IF OFFICE WILL BE OPEN: You need to be examined within the next 24 hours. Call your doctor when the office opens, and make an appointment. CALL BACK IF: * You become worse. CARE ADVICE given per Fever (Adult) guideline. warm transferred to office. After Care Instructions Given Call Event Type User Date / Time Description Referrals REFERRED TO PCP OFFICE

## 2014-05-28 NOTE — Progress Notes (Signed)
Pre visit review using our clinic review tool, if applicable. No additional management support is needed unless otherwise documented below in the visit note. 

## 2014-05-28 NOTE — Progress Notes (Signed)
   Subjective:    Patient ID: Jordan Anderson, female    DOB: 03/03/1961, 54 y.o.   MRN: [id number]  HPI [name] is a 54 year old married female nonsmoker who comes in today with a nine-day history of viral syndrome manifested by a congestion  She otherwise has felt well. No fever earache sore throat cough nausea vomiting or diarrhea.   Review of Systems Review of systems otherwise negative    Objective:   Physical Exam  Well-developed well-nourished female no acute distress vital signs stable she's afebrile HEENT were negative neck was supple no adenopathy lungs are clear      Assessment & Plan:  Persistent viral syndrome........ treat symptomatically

## 2014-05-29 ENCOUNTER — Ambulatory Visit: Payer: Self-pay | Admitting: Family Medicine

## 2014-10-03 ENCOUNTER — Encounter: Payer: Self-pay | Admitting: Internal Medicine

## 2015-02-18 ENCOUNTER — Other Ambulatory Visit: Payer: Self-pay

## 2015-02-18 DIAGNOSIS — Z1231 Encounter for screening mammogram for malignant neoplasm of breast: Secondary | ICD-10-CM

## 2015-03-19 ENCOUNTER — Ambulatory Visit
Admission: RE | Admit: 2015-03-19 | Discharge: 2015-03-19 | Disposition: A | Discharge status: Home / Self Care | Payer: BLUE CROSS/BLUE SHIELD | Source: Ambulatory Visit

## 2015-03-19 DIAGNOSIS — Z1231 Encounter for screening mammogram for malignant neoplasm of breast: Secondary | ICD-10-CM

## 2015-04-03 ENCOUNTER — Other Ambulatory Visit: Payer: Self-pay | Admitting: Family Medicine

## 2015-09-29 ENCOUNTER — Other Ambulatory Visit: Payer: BLUE CROSS/BLUE SHIELD

## 2015-10-06 ENCOUNTER — Encounter: Payer: BLUE CROSS/BLUE SHIELD | Admitting: Family Medicine

## 2015-12-08 ENCOUNTER — Ambulatory Visit (INDEPENDENT_AMBULATORY_CARE_PROVIDER_SITE_OTHER): Payer: BLUE CROSS/BLUE SHIELD | Admitting: Internal Medicine

## 2015-12-08 ENCOUNTER — Telehealth: Payer: Self-pay | Admitting: Family Medicine

## 2015-12-08 ENCOUNTER — Encounter: Payer: Self-pay | Admitting: Internal Medicine

## 2015-12-08 VITALS — BP 108/80 | Temp 98.8°F | Wt 112.4 lb

## 2015-12-08 DIAGNOSIS — L089 Local infection of the skin and subcutaneous tissue, unspecified: Secondary | ICD-10-CM

## 2015-12-08 DIAGNOSIS — M7021 Olecranon bursitis, right elbow: Secondary | ICD-10-CM

## 2015-12-08 MED ORDER — DOXYCYCLINE HYCLATE 100 MG PO CAPS: 100.0000 mg | ORAL_CAPSULE | Freq: Two times a day (BID) | ORAL | Status: DC

## 2015-12-08 NOTE — Progress Notes (Signed)
Pre visit review using our clinic review tool, if applicable. No additional management support is needed unless otherwise documented below in the visit note.  Chief Complaint  Patient presents with  . Elbow Problem    Rt elbow is red, has pain and warm to touch.  First noticed on Sunday.    HPI: Jordan Anderson 55 y.o.  sda  pcp NA Onset insidious a few days ago of right elbow tenderness without specific trauma although works in the yard a lot is playing tennis. She doesn't remember an injury thought that perhaps she could have had a bug bite. She has taken some over-the-counter ibuprofen was occasional help but with slightly better yesterday but more redness and swelling today. She can move her right arm full range of motion in the fever or chills.  No hx of same  ROS: See pertinent positives and negatives per HPI.  Past Medical History  Diagnosis Date  . Hypothyroidism   . Seasonal allergies     Family History  Problem Relation Age of Onset  . Colon cancer Neg Hx     Social History   Social History  . Marital Status: Married    Spouse Name: N/A  . Number of Children: N/A  . Years of Education: N/A   Social History Main Topics  . Smoking status: Never Smoker   . Smokeless tobacco: Never Used  . Alcohol Use: 6.0 oz/week    10 Glasses of wine per week  . Drug Use: No  . Sexual Activity: Not Asked   Other Topics Concern  . None   Social History Narrative    Outpatient Prescriptions Prior to Visit  Medication Sig Dispense Refill  . Calcium Carb-Cholecalciferol (CALCIUM + D3) 600-200 MG-UNIT TABS Take by mouth daily.    Marland Kitchen conjugated estrogens (PREMARIN) vaginal cream Place vaginally daily. 42.5 g 12  . fluticasone (FLONASE) 50 MCG/ACT nasal spray 1 spray up each nostril each bedtime 16 g 6  . montelukast (SINGULAIR) 10 MG tablet Take 1 tablet (10 mg total) by mouth at bedtime. 100 tablet 3  . SYNTHROID 75 MCG tablet Take 1 tablet (75 mcg total) by mouth daily. 100  tablet 3  . SYNTHROID 75 MCG tablet TAKE 1 TABLET (75 MCG TOTAL) BY MOUTH DAILY. 100 tablet 2  . HYDROcodone-homatropine (HYCODAN) 5-1.5 MG/5ML syrup 1/2-1 teaspoon at bedtime when necessary for cough and cold (Patient not taking: Reported on 05/28/2014) 120 mL 0   No facility-administered medications prior to visit.     EXAM:  BP 108/80 mmHg  Temp(Src) 98.8 F (37.1 C) (Oral)  Wt 112 lb 6.4 oz (50.984 kg)  Body mass index is 18.57 kg/(m^2).  GENERAL: vitals reviewed and listed above, alert, oriented, appears well hydrated and in no acute distress HEENT: atraumatic, conjunctiva  clear, no obvious abnormalities on inspection of external nose and ears  Right upper extremity with normal range of motion. There is some erythema around surrounding no streaking related to the olecranon external elbow. There is some tenderness and minimal swelling at the olecranon bursa but no fluid or fluctuance noted it is warm but not hot. There are no skin lesions.  PSYCH: pleasant and cooperative, no obvious depression or anxiety  ASSESSMENT AND PLAN:  Discussed the following assessment and plan:  Bursitis of elbow, right  ? - redness around area full rom, dont feel fluctuant   fluid to tap today empiric  rx for infection and close observation   Skin infection ? -  see note.   Expectant management. If  persistent or progressive consider further eval .  -Patient advised to return or notify health care team  if symptoms worsen ,persist or new concerns arise.  Patient Instructions  joint elbow protection   Relative rest  Compresses if feels better  Antibiotic  Ibuprofen 600 - 800 mg evey 8 hours until better or 2.5 aleve twice a day  Expect significant improvement  In the next 3 days   Fu care if not improving or  Getting worse fever  Spreading   Redness and inflammation .   I can recheck area  In  Friday or next week   Or as needed.      Standley Brooking. Mekhai Venuto M.D.

## 2015-12-08 NOTE — Telephone Encounter (Signed)
[facility] Day - Client [place] [facility] Patient Name: Jordan Anderson DOB: [birth year] Initial Comment Caller states she woke up Sunday with a sore and swollen that is getting worse. She thinks it might be an insect bite. Nurse Assessment Nurse: Dimas Chyle, RN, Dellis Filbert Date/Time Eilene Ghazi Time): 12/08/2015 8:20:04 AM Confirm and document reason for call. If symptomatic, describe symptoms. You must click the next button to save text entered. ---Caller states she woke up Sunday with a sore and now swollen that is getting worse. She thinks it might be an insect bite. Bite on right arm. Has the patient traveled out of the country within the last 30 days? ---No Does the patient have any new or worsening symptoms? ---Yes Will a triage be completed? ---Yes Related visit to physician within the last 2 weeks? ---N/A Does the PT have any chronic conditions? (i.e. diabetes, asthma, etc.) ---No Is this a behavioral health or substance abuse call? ---No Guidelines Guideline Title Affirmed Question Affirmed Notes Insect Bite [1] Red or very tender (to touch) area AND [2] getting larger over 48 hours after the bite Final Disposition User See Physician within 24 Hours Dimas Chyle, RN, [employer] Referrals REFERRED TO PCP OFFICE Disagree/Comply: Leta Baptist

## 2015-12-08 NOTE — Patient Instructions (Signed)
joint elbow protection   Relative rest  Compresses if feels better  Antibiotic  Ibuprofen 600 - 800 mg evey 8 hours until better or 2.5 aleve twice a day  Expect significant improvement  In the next 3 days   Fu care if not improving or  Getting worse fever  Spreading   Redness and inflammation .   I can recheck area  In  Friday or next week   Or as needed.

## 2015-12-25 ENCOUNTER — Other Ambulatory Visit: Payer: Self-pay | Admitting: Family Medicine

## 2016-01-22 ENCOUNTER — Encounter: Payer: Self-pay | Admitting: Adult Health

## 2016-01-22 ENCOUNTER — Ambulatory Visit (INDEPENDENT_AMBULATORY_CARE_PROVIDER_SITE_OTHER): Payer: BLUE CROSS/BLUE SHIELD | Admitting: Adult Health

## 2016-01-22 ENCOUNTER — Ambulatory Visit (INDEPENDENT_AMBULATORY_CARE_PROVIDER_SITE_OTHER)
Admission: RE | Admit: 2016-01-22 | Discharge: 2016-01-22 | Disposition: A | Discharge status: Home / Self Care | Payer: BLUE CROSS/BLUE SHIELD | Source: Ambulatory Visit | Attending: Adult Health | Admitting: Adult Health

## 2016-01-22 VITALS — BP 118/64 | Temp 98.1°F | Ht 65.25 in | Wt 116.8 lb

## 2016-01-22 DIAGNOSIS — M7021 Olecranon bursitis, right elbow: Secondary | ICD-10-CM | POA: Diagnosis not present

## 2016-01-22 DIAGNOSIS — M7989 Other specified soft tissue disorders: Secondary | ICD-10-CM | POA: Diagnosis not present

## 2016-01-22 DIAGNOSIS — M25521 Pain in right elbow: Secondary | ICD-10-CM | POA: Diagnosis not present

## 2016-01-22 NOTE — Progress Notes (Signed)
Subjective:    Patient ID: Jordan Anderson, female    DOB: 09/13/1960, 55 y.o.   MRN: [id number]  HPI   55 year old female, patient of Dr. Regis Bill who presents today for follow up regarding right elbow pain. She first saw her PCP for this on 12/08/2015 at which time there was complaint of right elbow pain without trauma, there was redness and swelling noted at that time. She was started on antibiotic therapy.   Today in the office she reports that she continues to have tenderness and swelling to the right elbow. She denies any redness or warmth. She has no loss of ROM   Review of Systems  Constitutional: Negative.   Musculoskeletal: Positive for arthralgias and joint swelling.  Skin: Negative.   Neurological: Negative.   All other systems reviewed and are negative.  Past Medical History:  Diagnosis Date  . Hypothyroidism   . Seasonal allergies     Social History   Social History  . Marital status: Married    Spouse name: N/A  . Number of children: N/A  . Years of education: N/A   Occupational History  . Not on file.   Social History Main Topics  . Smoking status: Never Smoker  . Smokeless tobacco: Never Used  . Alcohol use 6.0 oz/week    10 Glasses of wine per week  . Drug use: No  . Sexual activity: Not on file   Other Topics Concern  . Not on file   Social History Narrative  . No narrative on file    Past Surgical History:  Procedure Laterality Date  . ablation uterus  2005  . VEIN LIGATION Left 2012    Family History  Problem Relation Age of Onset  . Colon cancer Neg Hx     No Known Allergies  Current Outpatient Prescriptions on File Prior to Visit  Medication Sig Dispense Refill  . Calcium Carb-Cholecalciferol (CALCIUM + D3) 600-200 MG-UNIT TABS Take by mouth daily.    Marland Kitchen conjugated estrogens (PREMARIN) vaginal cream Place vaginally daily. 42.5 g 12  . fluticasone (FLONASE) 50 MCG/ACT nasal spray 1 spray up each nostril each bedtime 16 g 6  .  montelukast (SINGULAIR) 10 MG tablet Take 1 tablet (10 mg total) by mouth at bedtime. 100 tablet 3  . SYNTHROID 75 MCG tablet Take 1 tablet (75 mcg total) by mouth daily. 100 tablet 3  . SYNTHROID 75 MCG tablet TAKE 1 TABLET (75 MCG TOTAL) BY MOUTH DAILY. 100 tablet 1   No current facility-administered medications on file prior to visit.     BP 118/64   Temp 98.1 F (36.7 C) (Oral)   Ht 5' 5.25" (1.657 m)   Wt 116 lb 12.8 oz (53 kg)   BMI 19.29 kg/m       Objective:   Physical Exam  Constitutional: She is oriented to person, place, and time. She appears well-developed and well-nourished. No distress.  Musculoskeletal: Normal range of motion. She exhibits tenderness. She exhibits no deformity.  No fluid noted over the olecranon. There does appear to be slight slight soft tissue swelling over the olecranon. Feels as a moveable cyst. No signs of infection noted  Neurological: She is alert and oriented to person, place, and time.  Skin: Skin is warm and dry. No rash noted. She is not diaphoretic. No erythema. No pallor.  Psychiatric: She has a normal mood and affect. Her behavior is normal. Judgment and thought content normal.  Nursing note  and vitals reviewed.     Assessment & Plan:  1. Olecranon bursitis of right elbow - DG Elbow Complete Right; Future - Advised Nsaids and elbow compression.  - Follow up as needed - Consider referral to ortho depending on what x rays shows.   Jordan Peng, NP

## 2016-01-26 ENCOUNTER — Telehealth: Payer: Self-pay | Admitting: Adult Health

## 2016-01-26 NOTE — Telephone Encounter (Signed)
Updated patient on results of x ray

## 2016-02-26 ENCOUNTER — Other Ambulatory Visit: Payer: Self-pay | Admitting: Family Medicine

## 2016-02-26 DIAGNOSIS — Z1231 Encounter for screening mammogram for malignant neoplasm of breast: Secondary | ICD-10-CM

## 2016-03-21 ENCOUNTER — Ambulatory Visit
Admission: RE | Admit: 2016-03-21 | Discharge: 2016-03-21 | Disposition: A | Discharge status: Home / Self Care | Payer: BLUE CROSS/BLUE SHIELD | Source: Ambulatory Visit | Attending: Family Medicine | Admitting: Family Medicine

## 2016-03-21 DIAGNOSIS — Z1231 Encounter for screening mammogram for malignant neoplasm of breast: Secondary | ICD-10-CM | POA: Diagnosis not present

## 2016-03-23 ENCOUNTER — Other Ambulatory Visit (INDEPENDENT_AMBULATORY_CARE_PROVIDER_SITE_OTHER): Payer: BLUE CROSS/BLUE SHIELD

## 2016-03-23 DIAGNOSIS — Z0000 Encounter for general adult medical examination without abnormal findings: Secondary | ICD-10-CM

## 2016-03-23 LAB — LIPID PANEL
CHOLESTEROL: 188 mg/dL (ref 0–200)
HDL: 71.8 mg/dL (ref >39.00)
LDL Cholesterol: 102 mg/dL — ABNORMAL HIGH (ref 0–99)
NonHDL: 116.41
TRIGLYCERIDES: 71 mg/dL (ref 0.0–149.0)
Total CHOL/HDL Ratio: 3
VLDL: 14.2 mg/dL (ref 0.0–40.0)

## 2016-03-23 LAB — TSH: TSH: 2.85 u[IU]/mL (ref 0.35–4.50)

## 2016-03-23 LAB — BASIC METABOLIC PANEL
BUN: 16 mg/dL (ref 6–23)
CHLORIDE: 105 meq/L (ref 96–112)
CO2: 29 meq/L (ref 19–32)
CREATININE: 0.79 mg/dL (ref 0.40–1.20)
Calcium: 9.7 mg/dL (ref 8.4–10.5)
GFR: 80.01 mL/min (ref >60.00)
Glucose, Bld: 97 mg/dL (ref 70–99)
Potassium: 4 mEq/L (ref 3.5–5.1)
Sodium: 141 mEq/L (ref 135–145)

## 2016-03-23 LAB — POC URINALSYSI DIPSTICK (AUTOMATED)
Bilirubin, UA: NEGATIVE
Blood, UA: NEGATIVE
GLUCOSE UA: NEGATIVE
Ketones, UA: NEGATIVE
LEUKOCYTES UA: NEGATIVE
NITRITE UA: NEGATIVE
Protein, UA: NEGATIVE
Spec Grav, UA: 1.02
UROBILINOGEN UA: 0.2
pH, UA: 6

## 2016-03-23 LAB — CBC WITH DIFFERENTIAL/PLATELET
BASOS PCT: 0.4 % (ref 0.0–3.0)
Basophils Absolute: 0 10*3/uL (ref 0.0–0.1)
EOS ABS: 0.1 10*3/uL (ref 0.0–0.7)
Eosinophils Relative: 2.7 % (ref 0.0–5.0)
HEMATOCRIT: 41.2 % (ref 36.0–46.0)
Hemoglobin: 14.3 g/dL (ref 12.0–15.0)
LYMPHS PCT: 34.7 % (ref 12.0–46.0)
Lymphs Abs: 1.3 10*3/uL (ref 0.7–4.0)
MCHC: 34.6 g/dL (ref 30.0–36.0)
MCV: 93.1 fl (ref 78.0–100.0)
Monocytes Absolute: 0.3 10*3/uL (ref 0.1–1.0)
Monocytes Relative: 7.8 % (ref 3.0–12.0)
NEUTROS ABS: 2 10*3/uL (ref 1.4–7.7)
Neutrophils Relative %: 54.4 % (ref 43.0–77.0)
PLATELETS: 213 10*3/uL (ref 150.0–400.0)
RBC: 4.43 Mil/uL (ref 3.87–5.11)
RDW: 13.9 % (ref 11.5–15.5)
WBC: 3.7 10*3/uL — ABNORMAL LOW (ref 4.0–10.5)

## 2016-03-23 LAB — HEPATIC FUNCTION PANEL
ALBUMIN: 4.5 g/dL (ref 3.5–5.2)
ALT: 15 U/L (ref 0–35)
AST: 18 U/L (ref 0–37)
Alkaline Phosphatase: 40 U/L (ref 39–117)
BILIRUBIN DIRECT: 0.2 mg/dL (ref 0.0–0.3)
TOTAL PROTEIN: 6.9 g/dL (ref 6.0–8.3)
Total Bilirubin: 0.8 mg/dL (ref 0.2–1.2)

## 2016-03-29 ENCOUNTER — Encounter: Payer: Self-pay | Admitting: Family Medicine

## 2016-03-29 ENCOUNTER — Ambulatory Visit (INDEPENDENT_AMBULATORY_CARE_PROVIDER_SITE_OTHER): Payer: BLUE CROSS/BLUE SHIELD | Admitting: Family Medicine

## 2016-03-29 VITALS — BP 108/70 | HR 64 | Temp 97.9°F | Ht 64.75 in | Wt 116.2 lb

## 2016-03-29 DIAGNOSIS — E039 Hypothyroidism, unspecified: Secondary | ICD-10-CM

## 2016-03-29 DIAGNOSIS — J309 Allergic rhinitis, unspecified: Secondary | ICD-10-CM | POA: Diagnosis not present

## 2016-03-29 DIAGNOSIS — Z86010 Personal history of colonic polyps: Secondary | ICD-10-CM

## 2016-03-29 DIAGNOSIS — Z780 Asymptomatic menopausal state: Secondary | ICD-10-CM

## 2016-03-29 DIAGNOSIS — Z0000 Encounter for general adult medical examination without abnormal findings: Secondary | ICD-10-CM | POA: Diagnosis not present

## 2016-03-29 DIAGNOSIS — N952 Postmenopausal atrophic vaginitis: Secondary | ICD-10-CM

## 2016-03-29 DIAGNOSIS — Z23 Encounter for immunization: Secondary | ICD-10-CM | POA: Diagnosis not present

## 2016-03-29 MED ORDER — ESTROGENS, CONJUGATED 0.625 MG/GM VA CREA: TOPICAL_CREAM | Freq: Every day | VAGINAL | 12 refills | Status: DC

## 2016-03-29 MED ORDER — MONTELUKAST SODIUM 10 MG PO TABS: 10.0000 mg | ORAL_TABLET | Freq: Every day | ORAL | 3 refills | Status: DC

## 2016-03-29 MED ORDER — FLUOCINONIDE 0.05 % EX SOLN: CUTANEOUS | 3 refills | Status: DC

## 2016-03-29 MED ORDER — LEVOTHYROXINE SODIUM 75 MCG PO TABS: ORAL_TABLET | ORAL | 4 refills | Status: DC

## 2016-03-29 MED ORDER — FLUTICASONE PROPIONATE 50 MCG/ACT NA SUSP: NASAL | 6 refills | Status: DC

## 2016-03-29 NOTE — Patient Instructions (Signed)
Price the Premarin cream in [place],,,,,,,, [email or web address]  Lidex solution,,,,,, 2 drops left ear canal bedtime when necessary  Continue other medications  Return in one year sooner if any problem

## 2016-03-29 NOTE — Progress Notes (Signed)
Jordan Anderson is a delightful 55 year old married female nonsmoker who comes in today for general physical examination  She has a history of some minor bone loss takes calcium vitamin D and plays tennis 4 days a week  He uses Premarin vaginal cream for vaginal dryness  She is Flonase for allergic rhinitis and Singulair  She takes Synthroid 75 g daily for hypothyroidism  She gets routine eye care, dental care, BSE monthly, annual mammography, colonoscopy 2015 normal except she had some polyps. She can go back every 5 years  Vaccination history tetanus 2009 seasonal flu shot given today  Last. When she was in her 21s she had an endometrial ablation. Had a Pap 2015 which was normal. Low risk therefore every 3 years.  14 point review of systems reviewed are negative except for dryness left ear canal.  Physical examination.vs  BP 108/70 (BP Location: Left Arm, Patient Position: Sitting, Cuff Size: Normal)   Pulse 64   Temp 97.9 F (36.6 C) (Oral)   Ht 5' 4.75" (1.645 m)   Wt 116 lb 3.2 oz (52.7 kg)   BMI 19.49 kg/m  HEENT were negative except for dry ear canals. Neck was supple no adenopathy thyroid normal cardiopulmonary exam normal breast exam normal abdominal exam normal pelvic and rectal deferred extremities normal skin normal peripheral pulses normal  #1 healthy female  #2 hypothyroidism continue Synthroid  #3 allergic rhinitis continue Flonase and Singulair  #4 postmenopausal vaginal dryness continue Premarin cream. #5 dry ear canals...Marland KitchenMarland KitchenMarland Kitchen steroid drops.

## 2016-03-29 NOTE — Progress Notes (Signed)
Pre visit review using our clinic review tool, if applicable. No additional management support is needed unless otherwise documented below in the visit note. 

## 2016-06-21 DIAGNOSIS — L821 Other seborrheic keratosis: Secondary | ICD-10-CM | POA: Diagnosis not present

## 2016-06-21 DIAGNOSIS — D229 Melanocytic nevi, unspecified: Secondary | ICD-10-CM | POA: Diagnosis not present

## 2016-11-25 ENCOUNTER — Ambulatory Visit (INDEPENDENT_AMBULATORY_CARE_PROVIDER_SITE_OTHER): Payer: BLUE CROSS/BLUE SHIELD | Admitting: Internal Medicine

## 2016-11-25 ENCOUNTER — Encounter: Payer: Self-pay | Admitting: Internal Medicine

## 2016-11-25 VITALS — BP 104/76 | Temp 98.5°F | Wt 113.0 lb

## 2016-11-25 DIAGNOSIS — S30861A Insect bite (nonvenomous) of abdominal wall, initial encounter: Secondary | ICD-10-CM | POA: Diagnosis not present

## 2016-11-25 DIAGNOSIS — W57XXXA Bitten or stung by nonvenomous insect and other nonvenomous arthropods, initial encounter: Secondary | ICD-10-CM | POA: Diagnosis not present

## 2016-11-25 DIAGNOSIS — R21 Rash and other nonspecific skin eruption: Secondary | ICD-10-CM

## 2016-11-25 MED ORDER — DOXYCYCLINE HYCLATE 100 MG PO TABS: 100.0000 mg | ORAL_TABLET | Freq: Two times a day (BID) | ORAL | 0 refills | Status: DC

## 2016-11-25 NOTE — Patient Instructions (Addendum)
because of the ringed rash   Will do empiric treatment  For tick  Related disease. Cover for lyme  Although usually low risk in [place]  Tick bites.   Fu if fever  Other conterns recurring rashes  With out obvious cause .     Tick Bite Information, Adult Ticks are insects that draw blood for food. Most ticks live in shrubs and grassy areas. They climb onto people and animals that brush against the leaves and grasses that they rest on. Then they bite, attaching themselves to the skin. Most ticks are harmless, but some ticks carry germs that can spread to a person through a bite and cause a disease. To reduce your risk of getting a disease from a tick bite, it is important to take steps to prevent tick bites. It is also important to check for ticks after being outdoors. If you find that a tick has attached to you, watch for symptoms of disease. How can I prevent tick bites? Take these steps to help prevent tick bites when you are outdoors in an area where ticks are found:  Use insect repellent that has DEET (20% or higher), picaridin, or IR3535 in it. Use it on: ? Skin that is showing. ? The top of your boots. ? Your pant legs. ? Your sleeve cuffs.  For repellent products that contain permethrin, follow product instructions. Use these products on: ? Clothing. ? Gear. ? Boots. ? Tents.  Wear protective clothing. Long sleeves and long pants offer the best protection from ticks.  Wear light-colored clothing so you can see ticks more easily.  Tuck your pant legs into your socks.  If you go walking on a trail, stay in the middle of the trail so your skin, hair, and clothing do not touch the bushes.  Avoid walking through areas with long grass.  Check for ticks on your clothing, hair, and skin often while you are outside, and check again before you go inside. Make sure to check the places that ticks attach themselves most often. These places include the scalp, neck, armpits, waist, groin, and  joint areas. Ticks that carry a disease called Lyme disease have to be attached to the skin for 24-48 hours. Checking for ticks every day will lessen your risk of this and other diseases.  When you come indoors, wash your clothes and take a shower or a bath right away. Dry your clothes in a dryer on high heat for at least 60 minutes. This will kill any ticks in your clothes.  What is the proper way to remove a tick? If you find a tick on your body, remove it as soon as possible. Removing a tick sooner rather than later can prevent germs from passing from the tick to your body. To remove a tick that is crawling on your skin but has not bitten:  Go outdoors and brush the tick off.  Remove the tick with tape or a lint roller.  To remove a tick that is attached to your skin:  Wash your hands.  If you have latex gloves, put them on.  Use tweezers, curved forceps, or a tick-removal tool to gently grasp the tick as close to your skin and the tick's head as possible.  Gently pull with steady, upward pressure until the tick lets go. When removing the tick: ? Take care to keep the tick's head attached to its body. ? Do not twist or jerk the tick. This can make the tick's head  or mouth break off. ? Do not squeeze or crush the tick's body. This could force disease-carrying fluids from the tick into your body.  Do not try to remove a tick with heat, alcohol, petroleum jelly, or fingernail polish. Using these methods can cause the tick to salivate and regurgitate into your bloodstream, increasing your risk of getting a disease. What should I do after removing a tick?  Clean the bite area with soap and water, rubbing alcohol, or an iodine scrub.  If an antiseptic cream or ointment is available, apply a small amount to the bite site.  Wash and disinfect any instruments that you used to remove the tick. How should I dispose of a tick? To dispose of a live tick, use one of these methods:  Place it  in rubbing alcohol.  Place it in a sealed bag or container.  Wrap it tightly in tape.  Flush it down the toilet.  Contact a health care provider if:  You have symptoms of a disease after a tick bite. Symptoms of a tick-borne disease can occur from moments after the tick bites to up to 30 days after a tick is removed. Symptoms include: ? Muscle, joint, or bone pain. ? Difficulty walking or moving your legs. ? Numbness in the legs. ? Paralysis. ? Red rash around the tick bite area that is shaped like a target or a "bull's-eye." ? Redness and swelling in the area of the tick bite. ? Fever. ? Repeated vomiting. ? Diarrhea. ? Weight loss. ? Tender, swollen lymph glands. ? Shortness of breath. ? Cough. ? Pain in the abdomen. ? Headache. ? Abnormal tiredness. ? A change in your level of consciousness. ? Confusion. Get help right away if:  You are not able to remove a tick.  A part of a tick breaks off and gets stuck in your skin.  Your symptoms get worse. Summary  Ticks may carry germs that can spread to a person through a bite and cause disease.  Wear protective clothing and use insect repellent to prevent tick bites. Follow product instructions.  If you find a tick on your body, remove it as soon as possible. If the tick is attached, do not try to remove with heat, alcohol, petroleum jelly, or fingernail polish.  Remove the attached tick using tweezers, curved forceps, or a tick-removal tool. Gently pull with steady, upward pressure until the tick lets go. Do not twist or jerk the tick. Do not squeeze or crush the tick's body.  If you have symptoms after being bitten by a tick, contact a health care provider. This information is not intended to replace advice given to you by your health care provider. Make sure you discuss any questions you have with your health care provider. Document Released: 05/06/2000 Document Revised: 02/19/2016 Document Reviewed: 02/19/2016 Elsevier  Interactive Patient Education  [employer].

## 2016-11-25 NOTE — Progress Notes (Signed)
Chief Complaint  Patient presents with  . Insect Bite    1-2 weeks ago. Right abdoment.  Itching.    HPI: Jordan Anderson 56 y.o.  PCP NA  Has history of tick bites and being outside his had a rash and itchiness around in the area on the right abdomen for about 2 weeks and it isn't going away but spreading some no systemic symptoms believe the tick bite occurred in [place] not [place] No other exposures. Uncertain how attached.  ROS: See pertinent positives and negatives per HPI. No nfever chills joint swell  Past Medical History:  Diagnosis Date  . Hypothyroidism   . Seasonal allergies     Family History  Problem Relation Age of Onset  . Colon cancer Neg Hx     Social History   Social History  . Marital status: Married    Spouse name: N/A  . Number of children: N/A  . Years of education: N/A   Social History Main Topics  . Smoking status: Never Smoker  . Smokeless tobacco: Never Used  . Alcohol use 6.0 oz/week    10 Glasses of wine per week  . Drug use: No  . Sexual activity: Not Asked   Other Topics Concern  . None   Social History Narrative  . None    Outpatient Medications Prior to Visit  Medication Sig Dispense Refill  . fluocinonide (LIDEX) 0.05 % external solution 2 drops daily at bedtime left ear canal. 60 mL 3  . fluticasone (FLONASE) 50 MCG/ACT nasal spray 1 spray up each nostril each bedtime 16 g 6  . levothyroxine (SYNTHROID) 75 MCG tablet TAKE 1 TABLET (75 MCG TOTAL) BY MOUTH DAILY. 100 tablet 4  . montelukast (SINGULAIR) 10 MG tablet Take 1 tablet (10 mg total) by mouth at bedtime. 100 tablet 3  . Calcium Carb-Cholecalciferol (CALCIUM + D3) 600-200 MG-UNIT TABS Take by mouth daily.    Marland Kitchen conjugated estrogens (PREMARIN) vaginal cream Place vaginally daily. (Patient not taking: Reported on 11/25/2016) 42.5 g 12   No facility-administered medications prior to visit.      EXAM:  BP 104/76 (BP Location: Right Arm)   Temp 98.5 F (36.9  C) (Oral)   Wt 113 lb (51.3 kg)   BMI 18.95 kg/m   Body mass index is 18.95 kg/m.  GENERAL: vitals reviewed and listed above, alert, oriented, appears well hydrated and in no acute distress HEENT: atraumatic, conjunctiva  clear, no obvious abnormalities on inspection of external nose and ears NECK: no obvious masses on inspection  Skin abdomen shows about a 5 cm roundish rash [place] and some blotchy clearing in between without vesicle. MS: moves all extremities without noticeable focal  abnormality PSYCH: pleasant and cooperative, no obvious depression or anxiety  ASSESSMENT AND PLAN:  Discussed the following assessment and plan:  Tick bite of abdomen, initial encounter  Rash - possible EM rash not certain could be stari also but no systemic sx  2 weeks out   Risk benefit of medication discussed.  -Patient advised to return or notify health care team  if symptoms worsen ,persist or new concerns arise.  Patient Instructions  because of the ringed rash   Will do empiric treatment  For tick  Related disease. Cover for lyme  Although usually low risk in [place]  Tick bites.   Fu if fever  Other conterns recurring rashes  With out obvious cause .     Tick Bite Information, Adult Ticks are  insects that draw blood for food. Most ticks live in shrubs and grassy areas. They climb onto people and animals that brush against the leaves and grasses that they rest on. Then they bite, attaching themselves to the skin. Most ticks are harmless, but some ticks carry germs that can spread to a person through a bite and cause a disease. To reduce your risk of getting a disease from a tick bite, it is important to take steps to prevent tick bites. It is also important to check for ticks after being outdoors. If you find that a tick has attached to you, watch for symptoms of disease. How can I prevent tick bites? Take these steps to help prevent tick bites when you are outdoors in an area where ticks  are found:  Use insect repellent that has DEET (20% or higher), picaridin, or IR3535 in it. Use it on: ? Skin that is showing. ? The top of your boots. ? Your pant legs. ? Your sleeve cuffs.  For repellent products that contain permethrin, follow product instructions. Use these products on: ? Clothing. ? Gear. ? Boots. ? Tents.  Wear protective clothing. Long sleeves and long pants offer the best protection from ticks.  Wear light-colored clothing so you can see ticks more easily.  Tuck your pant legs into your socks.  If you go walking on a trail, stay in the middle of the trail so your skin, hair, and clothing do not touch the bushes.  Avoid walking through areas with long grass.  Check for ticks on your clothing, hair, and skin often while you are outside, and check again before you go inside. Make sure to check the places that ticks attach themselves most often. These places include the scalp, neck, armpits, waist, groin, and joint areas. Ticks that carry a disease called Lyme disease have to be attached to the skin for 24-48 hours. Checking for ticks every day will lessen your risk of this and other diseases.  When you come indoors, wash your clothes and take a shower or a bath right away. Dry your clothes in a dryer on high heat for at least 60 minutes. This will kill any ticks in your clothes.  What is the proper way to remove a tick? If you find a tick on your body, remove it as soon as possible. Removing a tick sooner rather than later can prevent germs from passing from the tick to your body. To remove a tick that is crawling on your skin but has not bitten:  Go outdoors and brush the tick off.  Remove the tick with tape or a lint roller.  To remove a tick that is attached to your skin:  Wash your hands.  If you have latex gloves, put them on.  Use tweezers, curved forceps, or a tick-removal tool to gently grasp the tick as close to your skin and the tick's head as  possible.  Gently pull with steady, upward pressure until the tick lets go. When removing the tick: ? Take care to keep the tick's head attached to its body. ? Do not twist or jerk the tick. This can make the tick's head or mouth break off. ? Do not squeeze or crush the tick's body. This could force disease-carrying fluids from the tick into your body.  Do not try to remove a tick with heat, alcohol, petroleum jelly, or fingernail polish. Using these methods can cause the tick to salivate and regurgitate into your bloodstream, increasing  your risk of getting a disease. What should I do after removing a tick?  Clean the bite area with soap and water, rubbing alcohol, or an iodine scrub.  If an antiseptic cream or ointment is available, apply a small amount to the bite site.  Wash and disinfect any instruments that you used to remove the tick. How should I dispose of a tick? To dispose of a live tick, use one of these methods:  Place it in rubbing alcohol.  Place it in a sealed bag or container.  Wrap it tightly in tape.  Flush it down the toilet.  Contact a health care provider if:  You have symptoms of a disease after a tick bite. Symptoms of a tick-borne disease can occur from moments after the tick bites to up to 30 days after a tick is removed. Symptoms include: ? Muscle, joint, or bone pain. ? Difficulty walking or moving your legs. ? Numbness in the legs. ? Paralysis. ? Red rash around the tick bite area that is shaped like a target or a "bull's-eye." ? Redness and swelling in the area of the tick bite. ? Fever. ? Repeated vomiting. ? Diarrhea. ? Weight loss. ? Tender, swollen lymph glands. ? Shortness of breath. ? Cough. ? Pain in the abdomen. ? Headache. ? Abnormal tiredness. ? A change in your level of consciousness. ? Confusion. Get help right away if:  You are not able to remove a tick.  A part of a tick breaks off and gets stuck in your skin.  Your  symptoms get worse. Summary  Ticks may carry germs that can spread to a person through a bite and cause disease.  Wear protective clothing and use insect repellent to prevent tick bites. Follow product instructions.  If you find a tick on your body, remove it as soon as possible. If the tick is attached, do not try to remove with heat, alcohol, petroleum jelly, or fingernail polish.  Remove the attached tick using tweezers, curved forceps, or a tick-removal tool. Gently pull with steady, upward pressure until the tick lets go. Do not twist or jerk the tick. Do not squeeze or crush the tick's body.  If you have symptoms after being bitten by a tick, contact a health care provider. This information is not intended to replace advice given to you by your health care provider. Make sure you discuss any questions you have with your health care provider. Document Released: 05/06/2000 Document Revised: 02/19/2016 Document Reviewed: 02/19/2016 Elsevier Interactive Patient Education  2018 [employer]. Antoneo Ghrist M.D.

## 2017-02-09 ENCOUNTER — Ambulatory Visit: Payer: Self-pay

## 2017-02-09 ENCOUNTER — Encounter: Payer: Self-pay | Admitting: Sports Medicine

## 2017-02-09 ENCOUNTER — Ambulatory Visit (INDEPENDENT_AMBULATORY_CARE_PROVIDER_SITE_OTHER): Payer: BLUE CROSS/BLUE SHIELD | Admitting: Sports Medicine

## 2017-02-09 VITALS — BP 110/70 | Ht 60.5 in | Wt 114.0 lb

## 2017-02-09 DIAGNOSIS — M7541 Impingement syndrome of right shoulder: Secondary | ICD-10-CM | POA: Diagnosis not present

## 2017-02-09 DIAGNOSIS — M25511 Pain in right shoulder: Secondary | ICD-10-CM | POA: Diagnosis not present

## 2017-02-09 DIAGNOSIS — M7521 Bicipital tendinitis, right shoulder: Secondary | ICD-10-CM

## 2017-02-09 DIAGNOSIS — M7581 Other shoulder lesions, right shoulder: Secondary | ICD-10-CM | POA: Diagnosis not present

## 2017-02-09 NOTE — Progress Notes (Signed)
Subjective:    Patient ID: Jordan Anderson, female    DOB: [birth year], 56 y.o.   MRN: [id number]  Patient is a 56 year old female who is an avid tennis player who presents to the clinic as a new patient for evaluation of right shoulder pain. She states her symptoms began in May with diffuse shoulder pain after playing tennis and have persisted since then. She states that when her hand is overhead for hitting a forehand wall that her pain is exacerbated as well as if it is externally rotated. She is not bothered by her serve which is more of an elbow motion. She is also not bothered with her backhand which is two-handed. She states that she has tried over-the-counter ibuprofen prior to her match as well as stopping activity for approximately 3 weeks with improvement of her symptoms. She is concerned that she will be making her symptoms worse which is why she came in for evaluation. She denies any significant numbness, tingling. She does report occasional weakness especially when lifting things like a bag of mulch. Denies any trauma to the area. She states on average she plays tended 12 hours of tennis per week with a competitive group and would like to continue doing this. She has played since she was 4-76 years old. Denies any prior injury to the shoulder.      Review of Systems  Constitutional: Negative for activity change.  Musculoskeletal: Positive for arthralgias (Right shoulder pain). Negative for back pain, joint swelling, myalgias, neck pain and neck stiffness.  Neurological: Positive for weakness (Right shoulder weakness). Negative for numbness.       Denies paresthesias       Objective:   Physical Exam  Constitutional: She is oriented to person, place, and time. She appears well-developed and well-nourished. No distress.  Musculoskeletal:  Upon inspection of her right upper extremity, her right scapula is protracted and she has increased muscle mass on the right as compared to  the left. She has mild tenderness to palpation in the bicipital groove and distally. She has full range of motion both in active and passive forms in shoulder flexion to 180, shoulder abduction to 180, external rotation and internal rotation. She does not exhibit significant discomfort from external or internal rotation. Strength testing: +5 out of 5 in shoulder flexion, +5 out of 5 in shoulder external rotation and and internal rotation, +5 out of 5 in shoulder abduction and abduction. Special tests: Positive Neer sign for discomfort in shoulder flexion. Positive Hawkin sign. Positive scapular retraction with improvement of her shoulder pain with passive retraction of her scapula. Negative empty can test. Negative O'Brien's test. Negative crossarm test. Negative speed's and Yergason's testing. Negative Spurling's test. Sensation is intact to light touch C5-T1. Radial pulses +2 out of 4 bilaterally.  Neurological: She is alert and oriented to person, place, and time. She displays normal reflexes.    Ultrasound of the right shoulder  Biceps tendon: Long axis 20% tear in her biceps tendon with surrounding inflammatory changes and calcific deposits. Short axis with swelling but not complete bulls eye change Subscapularis: Is intact Supraspinatus: Is intact with mild inflammatory change and evidence of mild impingement in her neutral position of scapular protraction. No subacromial impingement present. Some increased fluid in subacromial bursa Infraspinatus/teres minor: Are intact AC joint: No abnormalities  Ultrasound consistent with partial biceps tendon tear and supraspinatus tendinitis with bursal swelling  Ultrasound and interpretation by Wolfgang Phoenix. Oneida Alar, MD  Assessment & Plan:  Right scapular protraction Right shoulder impingement of the rotator cuff Right biceps brachii tendon tear, incomplete  Given the patient's history, physical exam, and sonographic findings, I do believe that her  competitive tennis has caused some compensatory changes in her body including scapular protraction and mild impingement. She does subsequently have a biceps tendon tear however small in nature. I am recommending stretching and strengthening exercises complete with: H's and T's, scapular stability exercises, and biceps strengthening exercises. She will perform these daily for the next 4-6 weeks. I'm recommending reevaluation in approximately 6 weeks to see how she is improving and to see improvement on ultrasound. If at that point or sooner if she notices worsening of symptoms, she should be reevaluated for consideration of further therapy including: Nitroglycerin patch. Patient was given education prior to discharge. She expressed understanding and agreement of this current plan. She may continue to compete in tennis however caution should be made on overheads, serves and hitting one hand high backhand volley

## 2017-02-10 DIAGNOSIS — M7581 Other shoulder lesions, right shoulder: Secondary | ICD-10-CM | POA: Insufficient documentation

## 2017-02-10 NOTE — Assessment & Plan Note (Signed)
We will start with conservative therapy She is strong and has only mild sxs HEP for scapular position RC series Biceps cursl Aleve 2 bid x 1 week and then prn  rescan in 6 wks

## 2017-03-23 ENCOUNTER — Ambulatory Visit (INDEPENDENT_AMBULATORY_CARE_PROVIDER_SITE_OTHER): Payer: BLUE CROSS/BLUE SHIELD | Admitting: Sports Medicine

## 2017-03-23 DIAGNOSIS — M7581 Other shoulder lesions, right shoulder: Secondary | ICD-10-CM | POA: Diagnosis not present

## 2017-03-23 NOTE — Progress Notes (Signed)
CC;  RT shoulder pain  Rt shoulder painful after a lot of tennis Evaluation showed some biceps and supraspinatus tendinopathy Now feels that her pain is much less Back playing tennis  We found scapular protraction Doing HEP and posture and shoulder feels much better  ROS No radicular sxs No neck pain  PE Thin F in NAD BP 108/72   Ht 5' 5.5" (1.664 m)   Wt 114 lb (51.7 kg)   BMI 18.68 kg/m   Shoulder:RT Inspection reveals no abnormalities, atrophy or asymmetry. Palpation is normal with no tenderness over AC joint or bicipital groove. ROM is full in all planes. Rotator cuff strength normal throughout. No signs of impingement with negative Neer and Hawkin's tests, empty can. Speeds and Yergason's tests normal. No labral pathology noted with negative Obrien's, negative clunk and good stability. Normal scapular function observed. No painful arc and no drop arm sign. No apprehension sign  Note on last visit all impingement test +  Scapular movement is now back to normal Posture improved

## 2017-03-23 NOTE — Assessment & Plan Note (Signed)
Keep HEP with light wt - 3 lbs Cont to work posture  This is much I,proved Keep up [place] paln but take exercise overhaed now  Reck in 2 mos if not resolbed and would repeat US then

## 2017-04-03 ENCOUNTER — Ambulatory Visit (INDEPENDENT_AMBULATORY_CARE_PROVIDER_SITE_OTHER): Payer: BLUE CROSS/BLUE SHIELD | Admitting: Family Medicine

## 2017-04-03 ENCOUNTER — Encounter: Payer: Self-pay | Admitting: Family Medicine

## 2017-04-03 VITALS — BP 110/86 | HR 66 | Temp 98.3°F | Wt 115.0 lb

## 2017-04-03 DIAGNOSIS — Z23 Encounter for immunization: Secondary | ICD-10-CM

## 2017-04-03 DIAGNOSIS — N952 Postmenopausal atrophic vaginitis: Secondary | ICD-10-CM

## 2017-04-03 DIAGNOSIS — J309 Allergic rhinitis, unspecified: Secondary | ICD-10-CM

## 2017-04-03 DIAGNOSIS — Z0000 Encounter for general adult medical examination without abnormal findings: Secondary | ICD-10-CM

## 2017-04-03 DIAGNOSIS — Z86010 Personal history of colonic polyps: Secondary | ICD-10-CM | POA: Diagnosis not present

## 2017-04-03 DIAGNOSIS — E039 Hypothyroidism, unspecified: Secondary | ICD-10-CM | POA: Diagnosis not present

## 2017-04-03 LAB — BASIC METABOLIC PANEL
BUN: 18 mg/dL (ref 6–23)
CHLORIDE: 102 meq/L (ref 96–112)
CO2: 31 mEq/L (ref 19–32)
CREATININE: 0.73 mg/dL (ref 0.40–1.20)
Calcium: 9.9 mg/dL (ref 8.4–10.5)
GFR: 87.32 mL/min (ref >60.00)
GLUCOSE: 92 mg/dL (ref 70–99)
Potassium: 4.2 mEq/L (ref 3.5–5.1)
SODIUM: 141 meq/L (ref 135–145)

## 2017-04-03 LAB — LIPID PANEL
Cholesterol: 183 mg/dL (ref 0–200)
HDL: 71.6 mg/dL (ref >39.00)
LDL Cholesterol: 100 mg/dL — ABNORMAL HIGH (ref 0–99)
NonHDL: 111.7
TRIGLYCERIDES: 58 mg/dL (ref 0.0–149.0)
Total CHOL/HDL Ratio: 3
VLDL: 11.6 mg/dL (ref 0.0–40.0)

## 2017-04-03 LAB — POCT URINALYSIS DIPSTICK
BILIRUBIN UA: NEGATIVE
GLUCOSE UA: NEGATIVE
Ketones, UA: NEGATIVE
Leukocytes, UA: NEGATIVE
Nitrite, UA: NEGATIVE
Protein, UA: NEGATIVE
RBC UA: NEGATIVE
SPEC GRAV UA: 1.025 (ref 1.010–1.025)
UROBILINOGEN UA: 0.2 U/dL
pH, UA: 6 (ref 5.0–8.0)

## 2017-04-03 LAB — CBC WITH DIFFERENTIAL/PLATELET
BASOS ABS: 0 10*3/uL (ref 0.0–0.1)
Basophils Relative: 0.8 % (ref 0.0–3.0)
Eosinophils Absolute: 0.1 10*3/uL (ref 0.0–0.7)
Eosinophils Relative: 2.5 % (ref 0.0–5.0)
HCT: 42.8 % (ref 36.0–46.0)
Hemoglobin: 14.4 g/dL (ref 12.0–15.0)
LYMPHS ABS: 1.3 10*3/uL (ref 0.7–4.0)
Lymphocytes Relative: 32.7 % (ref 12.0–46.0)
MCHC: 33.6 g/dL (ref 30.0–36.0)
MCV: 95.9 fl (ref 78.0–100.0)
Monocytes Absolute: 0.3 10*3/uL (ref 0.1–1.0)
Monocytes Relative: 7.7 % (ref 3.0–12.0)
Neutro Abs: 2.2 10*3/uL (ref 1.4–7.7)
Neutrophils Relative %: 56.3 % (ref 43.0–77.0)
PLATELETS: 216 10*3/uL (ref 150.0–400.0)
RBC: 4.46 Mil/uL (ref 3.87–5.11)
RDW: 13.8 % (ref 11.5–15.5)
WBC: 3.9 10*3/uL — ABNORMAL LOW (ref 4.0–10.5)

## 2017-04-03 LAB — HEPATIC FUNCTION PANEL
ALBUMIN: 4.6 g/dL (ref 3.5–5.2)
ALK PHOS: 42 U/L (ref 39–117)
ALT: 16 U/L (ref 0–35)
AST: 19 U/L (ref 0–37)
Bilirubin, Direct: 0.2 mg/dL (ref 0.0–0.3)
TOTAL PROTEIN: 6.8 g/dL (ref 6.0–8.3)
Total Bilirubin: 0.9 mg/dL (ref 0.2–1.2)

## 2017-04-03 LAB — TSH: TSH: 1.79 u[IU]/mL (ref 0.35–4.50)

## 2017-04-03 MED ORDER — FLUTICASONE PROPIONATE 50 MCG/ACT NA SUSP: NASAL | 6 refills | Status: DC

## 2017-04-03 MED ORDER — MONTELUKAST SODIUM 10 MG PO TABS: 10.0000 mg | ORAL_TABLET | Freq: Every day | ORAL | 3 refills | Status: DC

## 2017-04-03 MED ORDER — LEVOTHYROXINE SODIUM 75 MCG PO TABS: ORAL_TABLET | ORAL | 4 refills | Status: DC

## 2017-04-03 NOTE — Patient Instructions (Addendum)
Continue your good excellent health habits diet and exercise  Follow-up in one year sooner if any problems  Call your insurance company and find out where you can get the shingles vaccine  Seasonal flu shot today  Follow-up bone density  Labs today.............. I will call you if there is anything abnormal

## 2017-04-03 NOTE — Progress Notes (Signed)
Jordan Anderson is a delightful 56 year old married female nonsmoker who comes in today for general physical examination because of a history of hypothyroidism allergic rhinitis and postmenopausal vaginal dryness  For hypothyroidism she takes Synthroid 75 g daily. We'll check a TSH level today  For allergic rhinitis uses Singulair 10 mg steroid nasal spray  For postmenopausal vaginal dryness uses Premarin cream one applicator twice weekly when she feels like she needs it  She does not get routine eye care..........Marland Kitchen recommended annual eye exam, she does get regular dental care, BSE monthly, annual mammography, colonoscopy 2015 showed some polyps. She's due to go back in 5 years.  Vaccinations up-to-date she still flu shot today.  Last Pap smear was 2015. LMP was 7 years ago. She is been in a monogamous relationship all her life and her Pap smears have all been normal. She seen Dr. Harrington Challenger in the past however he's retired. She wishes to go back to see somebody in that group.  14 point review of systems otherwise negative  She saw Dr. Oneida Alar recently for pain in her right shoulder. Ultrasound showed inflammation and she's been treated symptomatically with exercise and Aleve twice a day.  BP 110/86 (BP Location: Left Arm, Patient Position: Sitting, Cuff Size: Normal)   Pulse 66   Temp 98.3 F (36.8 C) (Oral)   Wt 115 lb (52.2 kg)   BMI 18.85 kg/m  .

## 2017-04-05 ENCOUNTER — Ambulatory Visit (INDEPENDENT_AMBULATORY_CARE_PROVIDER_SITE_OTHER)
Admission: RE | Admit: 2017-04-05 | Discharge: 2017-04-05 | Disposition: A | Discharge status: Home / Self Care | Payer: BLUE CROSS/BLUE SHIELD | Source: Ambulatory Visit | Attending: Family Medicine | Admitting: Family Medicine

## 2017-04-05 DIAGNOSIS — Z13820 Encounter for screening for osteoporosis: Secondary | ICD-10-CM | POA: Diagnosis not present

## 2017-04-05 DIAGNOSIS — Z0000 Encounter for general adult medical examination without abnormal findings: Secondary | ICD-10-CM

## 2017-04-08 DIAGNOSIS — Z13820 Encounter for screening for osteoporosis: Secondary | ICD-10-CM | POA: Diagnosis not present

## 2017-04-11 ENCOUNTER — Other Ambulatory Visit: Payer: Self-pay | Admitting: Family Medicine

## 2017-04-11 DIAGNOSIS — Z1231 Encounter for screening mammogram for malignant neoplasm of breast: Secondary | ICD-10-CM

## 2017-05-10 ENCOUNTER — Ambulatory Visit: Payer: BLUE CROSS/BLUE SHIELD

## 2017-05-24 ENCOUNTER — Encounter: Payer: Self-pay | Admitting: Family Medicine

## 2017-05-24 ENCOUNTER — Ambulatory Visit: Payer: BLUE CROSS/BLUE SHIELD | Admitting: Family Medicine

## 2017-05-24 VITALS — BP 110/80 | HR 86 | Temp 98.0°F | Wt 115.7 lb

## 2017-05-24 DIAGNOSIS — J019 Acute sinusitis, unspecified: Secondary | ICD-10-CM | POA: Diagnosis not present

## 2017-05-24 MED ORDER — AMOXICILLIN-POT CLAVULANATE 875-125 MG PO TABS: 1.0000 | ORAL_TABLET | Freq: Two times a day (BID) | ORAL | 0 refills | Status: DC

## 2017-05-24 NOTE — Progress Notes (Signed)
Subjective:     Patient ID: Jordan Anderson, female   DOB: 1960-11-26, 57 y.o.   MRN: [id number]  HPI Patient seen with acute sinusitis symptoms. She developed symptoms about 3 weeks ago some maxillary and frontal sinus pressure. Occasional headaches. Intermittent laryngitis. Mild cough. Greenish nasal discharge past several days. She's taken over-the-counter medications without much improvement. Some increased malaise. No fever.  Generally healthy. No known drug allergies.  Past Medical History:  Diagnosis Date  . Hypothyroidism   . Seasonal allergies    Past Surgical History:  Procedure Laterality Date  . ablation uterus  2005  . VEIN LIGATION Left 2012    reports that  has never smoked. she has never used smokeless tobacco. She reports that she drinks about 6.0 oz of alcohol per week. She reports that she does not use drugs. family history is not on file. No Known Allergies   Review of Systems  Constitutional: Negative for chills and fever.  HENT: Positive for congestion, sinus pressure and sinus pain.   Respiratory: Positive for cough.        Objective:   Physical Exam  Constitutional: She appears well-developed and well-nourished.  HENT:  Right Ear: External ear normal.  Left Ear: External ear normal.  Mouth/Throat: Oropharynx is clear and moist. No oropharyngeal exudate.  Neck: Neck supple.  Cardiovascular: Normal rate and regular rhythm.  Pulmonary/Chest: Effort normal and breath sounds normal. No respiratory distress. She has no wheezes. She has no rales.  Lymphadenopathy:    She has no cervical adenopathy.       Assessment:     Probable acute sinusitis    Plan:     -Given duration of symptoms we'll start Augmentin 875 mg twice daily for 10 days -Consider saline nasal irrigation with Nettie pot -Follow-up as needed  Eulas Post MD [facility]

## 2017-05-24 NOTE — Patient Instructions (Signed)

## 2017-06-01 DIAGNOSIS — F4320 Adjustment disorder, unspecified: Secondary | ICD-10-CM | POA: Diagnosis not present

## 2017-06-07 DIAGNOSIS — F4320 Adjustment disorder, unspecified: Secondary | ICD-10-CM | POA: Diagnosis not present

## 2017-06-08 ENCOUNTER — Ambulatory Visit
Admission: RE | Admit: 2017-06-08 | Discharge: 2017-06-08 | Disposition: A | Discharge status: Home / Self Care | Payer: BLUE CROSS/BLUE SHIELD | Source: Ambulatory Visit | Attending: Family Medicine | Admitting: Family Medicine

## 2017-06-08 DIAGNOSIS — Z1231 Encounter for screening mammogram for malignant neoplasm of breast: Secondary | ICD-10-CM | POA: Diagnosis not present

## 2018-06-25 ENCOUNTER — Encounter: Payer: Self-pay | Admitting: Family Medicine

## 2018-06-25 ENCOUNTER — Ambulatory Visit: Payer: BLUE CROSS/BLUE SHIELD | Admitting: Family Medicine

## 2018-06-25 VITALS — BP 108/64 | HR 58 | Temp 97.7°F | Ht 65.5 in | Wt 116.2 lb

## 2018-06-25 DIAGNOSIS — Z1159 Encounter for screening for other viral diseases: Secondary | ICD-10-CM

## 2018-06-25 DIAGNOSIS — Z23 Encounter for immunization: Secondary | ICD-10-CM

## 2018-06-25 DIAGNOSIS — N952 Postmenopausal atrophic vaginitis: Secondary | ICD-10-CM

## 2018-06-25 DIAGNOSIS — E039 Hypothyroidism, unspecified: Secondary | ICD-10-CM

## 2018-06-25 DIAGNOSIS — J309 Allergic rhinitis, unspecified: Secondary | ICD-10-CM | POA: Diagnosis not present

## 2018-06-25 DIAGNOSIS — Z13220 Encounter for screening for lipoid disorders: Secondary | ICD-10-CM | POA: Diagnosis not present

## 2018-06-25 DIAGNOSIS — Z131 Encounter for screening for diabetes mellitus: Secondary | ICD-10-CM | POA: Diagnosis not present

## 2018-06-25 DIAGNOSIS — Z780 Asymptomatic menopausal state: Secondary | ICD-10-CM | POA: Diagnosis not present

## 2018-06-25 DIAGNOSIS — Z0001 Encounter for general adult medical examination with abnormal findings: Secondary | ICD-10-CM | POA: Diagnosis not present

## 2018-06-25 DIAGNOSIS — L309 Dermatitis, unspecified: Secondary | ICD-10-CM | POA: Insufficient documentation

## 2018-06-25 MED ORDER — FLUOCINONIDE 0.05 % EX SOLN: CUTANEOUS | 3 refills | Status: DC

## 2018-06-25 MED ORDER — LEVOTHYROXINE SODIUM 75 MCG PO TABS: ORAL_TABLET | ORAL | 3 refills | Status: DC

## 2018-06-25 MED ORDER — MONTELUKAST SODIUM 10 MG PO TABS: 10.0000 mg | ORAL_TABLET | Freq: Every day | ORAL | 3 refills | Status: DC

## 2018-06-25 MED ORDER — ESTROGENS, CONJUGATED 0.625 MG/GM VA CREA: TOPICAL_CREAM | Freq: Every day | VAGINAL | 12 refills | Status: DC

## 2018-06-25 NOTE — Assessment & Plan Note (Signed)
Continue Synthroid 75 mcg daily.  Check TSH.  Check CBC, C met as well.

## 2018-06-25 NOTE — Progress Notes (Signed)
Subjective:  Jordan Anderson is a 58 y.o. female who presents today for her annual comprehensive physical exam and to transfer care to this office.  HPI:  She has no acute complaints today.  She currently follows with GYN for Pap smears.  She will be getting her mammogram later this year.  She has several stable, chronic medical conditions outlined below  # Hypothryoidism -Currently on Synthroid 75 mcg daily and tolerating well.  # Allergic Rhinitis - Currently on flonase and singulair as needed.  Tolerating well.  # Eczema -Uses topical fluocinonide as needed  #Atrophic vaginitis -Uses Premarin cream as needed.  Lifestyle Diet: No specific  Exercise: Likes to walk, cycle, and going to [employer].   Depression screen PHQ 2/9 06/25/2018  Decreased Interest 0  Down, Depressed, Hopeless 0  PHQ - 2 Score 0    Health Maintenance Due  Topic Date Due  . Hepatitis C Screening  Nov 20, 1960  . HIV Screening  03/17/1975  . PAP SMEAR-Modifier  12/05/2016  . INFLUENZA VACCINE  12/21/2017  . TETANUS/TDAP  03/21/2018     ROS: Per HPI, otherwise a complete review of systems was negative.   PMH:  The following were reviewed and entered/updated in epic: Past Medical History:  Diagnosis Date  . Hypothyroidism   . Seasonal allergies    Patient Active Problem List   Diagnosis Date Noted  . Eczema 06/25/2018  . 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 History of colonic polyps 05/08/2009  . VARICOSE VEINS, LOWER EXTREMITIES 03/21/2008  . Hypothyroidism 01/23/2007  . Allergic rhinitis 01/23/2007   Past Surgical History:  Procedure Laterality Date  . ablation uterus  2005  . VEIN LIGATION Left 2012    Family History  Problem Relation Age of Onset  . Cancer Father   . Colon cancer Neg Hx     Medications- reviewed and updated Current Outpatient Medications  Medication Sig Dispense Refill  . Calcium Carb-Cholecalciferol (CALCIUM + D3) 600-200 MG-UNIT TABS Take by  mouth daily.    Marland Kitchen conjugated estrogens (PREMARIN) vaginal cream Place vaginally daily. 42.5 g 12  . fluocinonide (LIDEX) 0.05 % external solution 2 drops daily at bedtime left ear canal. 60 mL 3  . fluticasone (FLONASE) 50 MCG/ACT nasal spray 1 spray up each nostril each bedtime 16 g 6  . levothyroxine (SYNTHROID) 75 MCG tablet TAKE 1 TABLET (75 MCG TOTAL) BY MOUTH DAILY. 90 tablet 3  . montelukast (SINGULAIR) 10 MG tablet Take 1 tablet (10 mg total) by mouth at bedtime. 90 tablet 3   No current facility-administered medications for this visit.     Allergies-reviewed and updated No Known Allergies  Social History   Socioeconomic History  . Marital status: Married    Spouse name: Not on file  . Number of children: Not on file  . Years of education: Not on file  . Highest education level: Not on file  Occupational History  . Not on file  Social Needs  . Financial resource strain: Not on file  . Food insecurity:    Worry: Not on file    Inability: Not on file  . Transportation needs:    Medical: Not on file    Non-medical: Not on file  Tobacco Use  . Smoking status: Never Smoker  . Smokeless tobacco: Never Used  Substance and Sexual Activity  . Alcohol use: Yes    Alcohol/week: 10.0 standard drinks    Types: 10 Glasses of wine per week  . Drug use:  No  . Sexual activity: Not on file  Lifestyle  . Physical activity:    Days per week: Not on file    Minutes per session: Not on file  . Stress: Not on file  Relationships  . Social connections:    Talks on phone: Not on file    Gets together: Not on file    Attends religious service: Not on file    Active member of club or organization: Not on file    Attends meetings of clubs or organizations: Not on file    Relationship status: Not on file  Other Topics Concern  . Not on file  Social History Narrative  . Not on file    Objective:  Physical Exam: BP 108/64 (BP Location: Left Arm, Patient Position: Sitting, Cuff  Size: Normal)   Pulse (!) 58   Temp 97.7 F (36.5 C) (Oral)   Ht 5' 5.5" (1.664 m)   Wt 116 lb 3.2 oz (52.7 kg)   SpO2 99%   BMI 19.04 kg/m   Body mass index is 19.04 kg/m. Wt Readings from Last 3 Encounters:  06/25/18 116 lb 3.2 oz (52.7 kg)  05/24/17 115 lb 11.2 oz (52.5 kg)  04/03/17 115 lb (52.2 kg)   Gen: NAD, resting comfortably HEENT: TMs normal bilaterally. OP clear. No thyromegaly noted.  CV: RRR with no murmurs appreciated Pulm: NWOB, CTAB with no crackles, wheezes, or rhonchi GI: Normal bowel sounds present. Soft, Nontender, Nondistended. MSK: no edema, cyanosis, or clubbing noted Skin: warm, dry Neuro: CN2-12 grossly intact. Strength 5/5 in upper and lower extremities. Reflexes symmetric and intact bilaterally.  Psych: Normal affect and thought content  Assessment/Plan:  Eczema Stable.  Continue fluocinonide as needed.  Postmenopausal atrophic vaginitis Stable.  Continue Premarin as needed.  Hypothyroidism Continue Synthroid 75 mcg daily.  Check TSH.  Check CBC, C met as well.  Allergic rhinitis Stable.  Continue Flonase and Singulair as needed.   Preventative Healthcare: Flu shot and Tdap given today.  Check lipid panel.  Check hep C antibody.  Screen for diabetes with A1c.  Patient Counseling(The following topics were reviewed and/or handout was given):  -Nutrition: Stressed importance of moderation in sodium/caffeine intake, saturated fat and cholesterol, caloric balance, sufficient intake of fresh fruits, vegetables, and fiber.  -Stressed the importance of regular exercise.   -Substance Abuse: Discussed cessation/primary prevention of tobacco, alcohol, or other drug use; driving or other dangerous activities under the influence; availability of treatment for abuse.   -Injury prevention: Discussed safety belts, safety helmets, smoke detector, smoking near bedding or upholstery.   -Sexuality: Discussed sexually transmitted diseases, partner selection, use  of condoms, avoidance of unintended pregnancy and contraceptive alternatives.   -Dental health: Discussed importance of regular tooth brushing, flossing, and dental visits.  -Health maintenance and immunizations reviewed. Please refer to Health maintenance section.  Return to care in 1 year for next preventative visit.   Algis Greenhouse. [name], MD 06/25/2018 5:06 PM

## 2018-06-25 NOTE — Assessment & Plan Note (Signed)
Stable.  Continue Premarin as needed.

## 2018-06-25 NOTE — Assessment & Plan Note (Signed)
Stable.  Continue fluocinonide as needed.

## 2018-06-25 NOTE — Patient Instructions (Addendum)
It was very nice to see you today!  Keep up the good work!  I will refill your medications today.  We will check blood work.  Female sure you are taking at least 800IU of vitamin D and 1297m of calcium daily.   We will give you your flu and tdap vaccines today.  Come back to see me in 1 year, or sooner as needed.   Take care, Dr PJerline Pain  Preventive Care 40-64 Years, Female Preventive care refers to lifestyle choices and visits with your health care provider that can promote health and wellness. What does preventive care include?   A yearly physical exam. This is also called an annual well check.  Dental exams once or twice a year.  Routine eye exams. Ask your health care provider how often you should have your eyes checked.  Personal lifestyle choices, including: ? Daily care of your teeth and gums. ? Regular physical activity. ? Eating a healthy diet. ? Avoiding tobacco and drug use. ? Limiting alcohol use. ? Practicing safe sex. ? Taking low-dose aspirin daily starting at age 59 ? Taking vitamin and mineral supplements as recommended by your health care provider. What happens during an annual well check? The services and screenings done by your health care provider during your annual well check will depend on your age, overall health, lifestyle risk factors, and family history of disease. Counseling Your health care provider may ask you questions about your:  Alcohol use.  Tobacco use.  Drug use.  Emotional well-being.  Home and relationship well-being.  Sexual activity.  Eating habits.  Work and work eStatistician  Method of birth control.  Menstrual cycle.  Pregnancy history. Screening You may have the following tests or measurements:  Height, weight, and BMI.  Blood pressure.  Lipid and cholesterol levels. These may be checked every 5 years, or more frequently if you are over 517years old.  Skin check.  Lung cancer screening. You may have  this screening every year starting at age 2716if you have a 30-pack-year history of smoking and currently smoke or have quit within the past 15 years.  Colorectal cancer screening. All adults should have this screening starting at age 2720and continuing until age 59 Your health care provider may recommend screening at age 59 You will have tests every 1-10 years, depending on your results and the type of screening test. People at increased risk should start screening at an earlier age. Screening tests may include: ? Guaiac-based fecal occult blood testing. ? Fecal immunochemical test (FIT). ? Stool DNA test. ? Virtual colonoscopy. ? Sigmoidoscopy. During this test, a flexible tube with a tiny camera (sigmoidoscope) is used to examine your rectum and lower colon. The sigmoidoscope is inserted through your anus into your rectum and lower colon. ? Colonoscopy. During this test, a long, thin, flexible tube with a tiny camera (colonoscope) is used to examine your entire colon and rectum.  Hepatitis C blood test.  Hepatitis B blood test.  Sexually transmitted disease (STD) testing.  Diabetes screening. This is done by checking your blood sugar (glucose) after you have not eaten for a while (fasting). You may have this done every 1-3 years.  Mammogram. This may be done every 1-2 years. Talk to your health care provider about when you should start having regular mammograms. This may depend on whether you have a family history of breast cancer.  BRCA-related cancer screening. This may be done if you have a family history  of breast, ovarian, tubal, or peritoneal cancers.  Pelvic exam and Pap test. This may be done every 3 years starting at age 58. Starting at age 55, this may be done every 5 years if you have a Pap test in combination with an HPV test.  Bone density scan. This is done to screen for osteoporosis. You may have this scan if you are at high risk for osteoporosis. Discuss your test results,  treatment options, and if necessary, the need for more tests with your health care provider. Vaccines Your health care provider may recommend certain vaccines, such as:  Influenza vaccine. This is recommended every year.  Tetanus, diphtheria, and acellular pertussis (Tdap, Td) vaccine. You may need a Td booster every 10 years.  Varicella vaccine. You may need this if you have not been vaccinated.  Zoster vaccine. You may need this after age 13.  Measles, mumps, and rubella (MMR) vaccine. You may need at least one dose of MMR if you were born in 1957 or later. You may also need a second dose.  Pneumococcal 13-valent conjugate (PCV13) vaccine. You may need this if you have certain conditions and were not previously vaccinated.  Pneumococcal polysaccharide (PPSV23) vaccine. You may need one or two doses if you smoke cigarettes or if you have certain conditions.  Meningococcal vaccine. You may need this if you have certain conditions.  Hepatitis A vaccine. You may need this if you have certain conditions or if you travel or work in places where you may be exposed to hepatitis A.  Hepatitis B vaccine. You may need this if you have certain conditions or if you travel or work in places where you may be exposed to hepatitis B.  Haemophilus influenzae type b (Hib) vaccine. You may need this if you have certain conditions. Talk to your health care provider about which screenings and vaccines you need and how often you need them. This information is not intended to replace advice given to you by your health care provider. Make sure you discuss any questions you have with your health care provider. Document Released: 06/05/2015 Document Revised: 06/29/2017 Document Reviewed: 03/10/2015 Elsevier Interactive Patient Education  2019 [employer].

## 2018-06-25 NOTE — Assessment & Plan Note (Signed)
Stable.  Continue Flonase and Singulair as needed. 

## 2018-06-26 LAB — CBC
HEMATOCRIT: 41.6 % (ref 36.0–46.0)
Hemoglobin: 14.1 g/dL (ref 12.0–15.0)
MCHC: 33.8 g/dL (ref 30.0–36.0)
MCV: 94.6 fl (ref 78.0–100.0)
Platelets: 217 10*3/uL (ref 150.0–400.0)
RBC: 4.4 Mil/uL (ref 3.87–5.11)
RDW: 14.1 % (ref 11.5–15.5)
WBC: 4 10*3/uL (ref 4.0–10.5)

## 2018-06-26 LAB — LIPID PANEL
Cholesterol: 176 mg/dL (ref 0–200)
HDL: 66.5 mg/dL (ref >39.00)
LDL Cholesterol: 99 mg/dL (ref 0–99)
NonHDL: 109.7
Total CHOL/HDL Ratio: 3
Triglycerides: 55 mg/dL (ref 0.0–149.0)
VLDL: 11 mg/dL (ref 0.0–40.0)

## 2018-06-26 LAB — COMPREHENSIVE METABOLIC PANEL
ALT: 21 U/L (ref 0–35)
AST: 22 U/L (ref 0–37)
Albumin: 4.7 g/dL (ref 3.5–5.2)
Alkaline Phosphatase: 39 U/L (ref 39–117)
BILIRUBIN TOTAL: 0.6 mg/dL (ref 0.2–1.2)
BUN: 21 mg/dL (ref 6–23)
CO2: 30 meq/L (ref 19–32)
Calcium: 10 mg/dL (ref 8.4–10.5)
Chloride: 103 mEq/L (ref 96–112)
Creatinine, Ser: 0.73 mg/dL (ref 0.40–1.20)
GFR: 81.8 mL/min (ref >60.00)
GLUCOSE: 96 mg/dL (ref 70–99)
Potassium: 3.8 mEq/L (ref 3.5–5.1)
SODIUM: 141 meq/L (ref 135–145)
Total Protein: 6.8 g/dL (ref 6.0–8.3)

## 2018-06-26 LAB — HEMOGLOBIN A1C: Hgb A1c MFr Bld: 5.1 % (ref 4.6–6.5)

## 2018-06-26 LAB — TSH: TSH: 0.94 u[IU]/mL (ref 0.35–4.50)

## 2018-06-26 LAB — HEPATITIS C ANTIBODY
Hepatitis C Ab: NONREACTIVE
SIGNAL TO CUT-OFF: 0.01 (ref <1.00)

## 2018-06-26 NOTE — Progress Notes (Signed)
Dr Marigene Ehlers interpretation of your lab work:  Good news! Your blood work is all NORMAL. We do not need to make any changes to your treatment plan at this time. Keep up the good work and we can recheck again in a year.    If you have any additional questions, please give [place] a call or send [place] a message through [place].  Take care, Dr [name]

## 2018-06-29 ENCOUNTER — Other Ambulatory Visit: Payer: Self-pay | Admitting: Family Medicine

## 2018-06-29 DIAGNOSIS — Z1231 Encounter for screening mammogram for malignant neoplasm of breast: Secondary | ICD-10-CM

## 2018-07-25 ENCOUNTER — Ambulatory Visit
Admission: RE | Admit: 2018-07-25 | Discharge: 2018-07-25 | Disposition: A | Discharge status: Home / Self Care | Payer: BLUE CROSS/BLUE SHIELD | Source: Ambulatory Visit | Attending: Family Medicine | Admitting: Family Medicine

## 2018-07-25 DIAGNOSIS — Z1231 Encounter for screening mammogram for malignant neoplasm of breast: Secondary | ICD-10-CM

## 2018-07-27 ENCOUNTER — Other Ambulatory Visit: Payer: Self-pay | Admitting: Family Medicine

## 2018-07-27 DIAGNOSIS — R928 Other abnormal and inconclusive findings on diagnostic imaging of breast: Secondary | ICD-10-CM

## 2018-08-01 ENCOUNTER — Other Ambulatory Visit: Payer: Self-pay

## 2018-08-01 ENCOUNTER — Other Ambulatory Visit: Payer: Self-pay | Admitting: Family Medicine

## 2018-08-01 ENCOUNTER — Ambulatory Visit
Admission: RE | Admit: 2018-08-01 | Discharge: 2018-08-01 | Disposition: A | Discharge status: Home / Self Care | Payer: BLUE CROSS/BLUE SHIELD | Source: Ambulatory Visit | Attending: Family Medicine | Admitting: Family Medicine

## 2018-08-01 DIAGNOSIS — R928 Other abnormal and inconclusive findings on diagnostic imaging of breast: Secondary | ICD-10-CM

## 2018-08-01 DIAGNOSIS — R52 Pain, unspecified: Secondary | ICD-10-CM

## 2018-08-01 DIAGNOSIS — N6489 Other specified disorders of breast: Secondary | ICD-10-CM | POA: Diagnosis not present

## 2018-08-01 DIAGNOSIS — R922 Inconclusive mammogram: Secondary | ICD-10-CM | POA: Diagnosis not present

## 2018-08-08 ENCOUNTER — Encounter: Payer: Self-pay | Admitting: Gastroenterology

## 2018-08-28 ENCOUNTER — Telehealth: Payer: Self-pay | Admitting: *Deleted

## 2018-08-28 NOTE — Telephone Encounter (Signed)
Patient called, no answer,left a message for pt to call us back to reschedule colonoscopy due to Covid-19. Also told her the PV would be over the phone.

## 2018-08-30 ENCOUNTER — Other Ambulatory Visit: Payer: Self-pay

## 2018-08-30 ENCOUNTER — Ambulatory Visit (AMBULATORY_SURGERY_CENTER): Payer: BLUE CROSS/BLUE SHIELD

## 2018-08-30 VITALS — Ht 65.0 in | Wt 113.0 lb

## 2018-08-30 DIAGNOSIS — Z86010 Personal history of colonic polyps: Secondary | ICD-10-CM

## 2018-08-30 MED ORDER — NA SULFATE-K SULFATE-MG SULF 17.5-3.13-1.6 GM/177ML PO SOLN: 1.0000 | Freq: Once | ORAL | 0 refills | Status: AC

## 2018-08-30 NOTE — Progress Notes (Signed)
Denies allergies to eggs or soy products. Denies complication of anesthesia or sedation. Denies use of weight loss medication. Denies use of O2.   Emmi instructions declined.   Pre-Visit was conducted by phone due to Covid 19. Insurance was verified. Instructions were mailed to patients confirmed home address. A 15.00 coupon for Suprep was given to the patient. Patient was encouraged to call with any questions or concerns regarding the instructions.

## 2018-09-14 ENCOUNTER — Encounter: Payer: BLUE CROSS/BLUE SHIELD | Admitting: Gastroenterology

## 2018-09-24 ENCOUNTER — Encounter: Payer: Self-pay | Admitting: *Deleted

## 2018-09-25 ENCOUNTER — Encounter: Payer: Self-pay | Admitting: Gastroenterology

## 2018-10-09 ENCOUNTER — Encounter: Payer: BLUE CROSS/BLUE SHIELD | Admitting: Gastroenterology

## 2018-10-10 ENCOUNTER — Telehealth: Payer: Self-pay | Admitting: *Deleted

## 2018-10-10 NOTE — Telephone Encounter (Signed)
Covid-19 travel screening questions  Have you traveled in the last 14 days?no If yes where?  Do you now or have you had a fever in the last 14 days?no  Do you have any respiratory symptoms of shortness of breath or cough now or in the last 14 days?no  Do you have a medical history of Congestive Heart Failure?  Do you have a medical history of lung disease?  Do you have any family members or close contacts with diagnosed or suspected Covid-19?no   Pt made aware of care partner policy and will bring a mask with her. SM

## 2018-10-12 ENCOUNTER — Other Ambulatory Visit: Payer: Self-pay

## 2018-10-12 ENCOUNTER — Ambulatory Visit (AMBULATORY_SURGERY_CENTER): Payer: BLUE CROSS/BLUE SHIELD | Admitting: Gastroenterology

## 2018-10-12 ENCOUNTER — Encounter: Payer: Self-pay | Admitting: Gastroenterology

## 2018-10-12 VITALS — BP 111/44 | HR 50 | Temp 98.3°F | Resp 29 | Ht 65.5 in | Wt 116.0 lb

## 2018-10-12 DIAGNOSIS — Z860100 Personal history of colon polyps, unspecified: Secondary | ICD-10-CM

## 2018-10-12 DIAGNOSIS — D120 Benign neoplasm of cecum: Secondary | ICD-10-CM | POA: Diagnosis not present

## 2018-10-12 DIAGNOSIS — Z1211 Encounter for screening for malignant neoplasm of colon: Secondary | ICD-10-CM | POA: Diagnosis not present

## 2018-10-12 DIAGNOSIS — K640 First degree hemorrhoids: Secondary | ICD-10-CM

## 2018-10-12 MED ORDER — SODIUM CHLORIDE 0.9 % IV SOLN: 500.0000 mL | Freq: Once | INTRAVENOUS | Status: DC

## 2018-10-12 NOTE — Progress Notes (Signed)
No problems noted in the recovery room. maw 

## 2018-10-12 NOTE — Op Note (Signed)
[facility] Patient Name: Jordan Anderson Procedure Date: 10/12/2018 11:25 AM MRN: [id number] Endoscopist: Gerrit Heck , MD Age: 58 Referring MD:  Date of Birth: [birth year] Gender: Female Account #: [id number] Procedure:                Colonoscopy Indications:              Surveillance: Personal history of adenomatous                            polyps on last colonoscopy 5 years ago Medicines:                Monitored Anesthesia Care Procedure:                Pre-Anesthesia Assessment:                           - Prior to the procedure, a History and Physical                            was performed, and patient medications and                            allergies were reviewed. The patient's tolerance of                            previous anesthesia was also reviewed. The risks                            and benefits of the procedure and the sedation                            options and risks were discussed with the patient.                            All questions were answered, and informed consent                            was obtained. Prior Anticoagulants: The patient has                            taken no previous anticoagulant or antiplatelet                            agents. ASA Grade Assessment: II - A patient with                            mild systemic disease. After reviewing the risks                            and benefits, the patient was deemed in                            satisfactory condition to undergo the procedure.  After obtaining informed consent, the colonoscope                            was passed under direct vision. Throughout the                            procedure, the patient's blood pressure, pulse, and                            oxygen saturations were monitored continuously. The                            Colonoscope was introduced through the anus and                            advanced to the the  terminal ileum. The colonoscopy                            was performed with minimal difficulty- tortuous                            sigmoid colon. The patient tolerated the procedure                            well. The quality of the bowel preparation was                            adequate. The terminal ileum, ileocecal valve,                            appendiceal orifice, and rectum were photographed. Scope In: 11:36:06 AM Scope Out: 11:50:02 AM Scope Withdrawal Time: 0 hours 8 minutes 16 seconds  Total Procedure Duration: 0 hours 13 minutes 56 seconds  Findings:                 The perianal and digital rectal examinations were                            normal.                           A 4 mm polyp was found in the cecum. The polyp was                            sessile. The polyp was removed with a cold snare.                            Resection and retrieval were complete. Estimated                            blood loss was minimal.                           The sigmoid colon was moderately tortuous.  Advancing the scope required using manual pressure.                           Non-bleeding internal hemorrhoids were found during                            retroflexion. The hemorrhoids were small.                           The exam was otherwise normal throughout the                            examined colon.                           The terminal ileum appeared normal. Complications:            No immediate complications. Estimated Blood Loss:     Estimated blood loss was minimal. Impression:               - One 4 mm polyp in the cecum, removed with a cold                            snare. Resected and retrieved.                           - Tortuous colon.                           - Non-bleeding internal hemorrhoids.                           - The examined portion of the ileum was normal. Recommendation:           - Patient has a contact number  available for                            emergencies. The signs and symptoms of potential                            delayed complications were discussed with the                            patient. Return to normal activities tomorrow.                            Written discharge instructions were provided to the                            patient.                           - Resume previous diet today.                           - Continue present medications.                           -  Await pathology results.                           - Repeat colonoscopy in 5 years for surveillance.                           - Return to GI office PRN. Gerrit Heck, MD 10/12/2018 11:58:05 AM

## 2018-10-12 NOTE — Patient Instructions (Signed)
YOU HAD AN ENDOSCOPIC PROCEDURE TODAY AT [facility]:   Refer to the procedure report that was given to you for any specific questions about what was found during the examination.  If the procedure report does not answer your questions, please call your gastroenterologist to clarify.  If you requested that your care partner not be given the details of your procedure findings, then the procedure report has been included in a sealed envelope for you to review at your convenience later.  YOU SHOULD EXPECT: Some feelings of bloating in the abdomen. Passage of more gas than usual.  Walking can help get rid of the air that was put into your GI tract during the procedure and reduce the bloating. If you had a lower endoscopy (such as a colonoscopy or flexible sigmoidoscopy) you may notice spotting of blood in your stool or on the toilet paper. If you underwent a bowel prep for your procedure, you may not have a normal bowel movement for a few days.  Please Note:  You might notice some irritation and congestion in your nose or some drainage.  This is from the oxygen used during your procedure.  There is no need for concern and it should clear up in a day or so.  SYMPTOMS TO REPORT IMMEDIATELY:   Following lower endoscopy (colonoscopy or flexible sigmoidoscopy):  Excessive amounts of blood in the stool  Significant tenderness or worsening of abdominal pains  Swelling of the abdomen that is new, acute  Fever of 100F or higher    For urgent or emergent issues, a gastroenterologist can be reached at any hour by calling [phone].   DIET:  We do recommend a small meal at first, but then you may proceed to your regular diet.  Drink plenty of fluids but you should avoid alcoholic beverages for 24 hours.  ACTIVITY:  You should plan to take it easy for the rest of today and you should NOT DRIVE or use heavy machinery until tomorrow (because of the sedation medicines used during the test).     FOLLOW UP: Our staff will call the number listed on your records 48-72 hours following your procedure to check on you and address any questions or concerns that you may have regarding the information given to you following your procedure. If we do not reach you, we will leave a message.  We will attempt to reach you two times.  During this call, we will ask if you have developed any symptoms of COVID 19. If you develop any symptoms (for example fever, flu-like symptoms, shortness of breath, cough etc.) before then, please call [phone].  If any biopsies were taken you will be contacted by phone or by letter within the next 1-3 weeks.  Please call us at [phone] if you have not heard about the biopsies in 3 weeks.    SIGNATURES/CONFIDENTIALITY: You and/or your care partner have signed paperwork which will be entered into your electronic medical record.  These signatures attest to the fact that that the information above on your After Visit Summary has been reviewed and is understood.  Full responsibility of the confidentiality of this discharge information lies with you and/or your care-partner.    Handouts were given to your care partner on polyps and hemorrhoids. You may resume your current medications today. Await biopsy results. Repeat colonoscopy in 5 years. Please call if any questions or concerns.

## 2018-10-12 NOTE — Progress Notes (Signed)
Called to room to assist during endoscopic procedure.  Patient ID and intended procedure confirmed with present staff. Received instructions for my participation in the procedure from the performing physician.  

## 2018-10-12 NOTE — Progress Notes (Signed)
Pt's states no medical or surgical changes since previsit or office visit. 

## 2018-10-12 NOTE — Progress Notes (Signed)
Report given to PACU, vss 

## 2018-10-12 NOTE — Progress Notes (Signed)
Temperature taken by [facility], CMA, VS taken by Rica Mote, CMA

## 2018-10-16 ENCOUNTER — Telehealth: Payer: Self-pay

## 2018-10-16 ENCOUNTER — Telehealth: Payer: Self-pay | Admitting: *Deleted

## 2018-10-16 ENCOUNTER — Encounter: Payer: Self-pay | Admitting: Gastroenterology

## 2018-10-16 NOTE — Telephone Encounter (Signed)
  Follow up Call-  Call back number 10/12/2018  Post procedure Call Back phone  # [phone]  Permission to leave phone message Yes  Some recent data might be hidden     Patient questions:  Message left to call us if necessary.

## 2018-10-16 NOTE — Telephone Encounter (Signed)
Left message on 2nd follow up call. 

## 2018-10-18 ENCOUNTER — Encounter: Payer: Self-pay | Admitting: Gastroenterology

## 2019-02-20 ENCOUNTER — Other Ambulatory Visit: Payer: Self-pay

## 2019-02-20 DIAGNOSIS — Z20822 Contact with and (suspected) exposure to covid-19: Secondary | ICD-10-CM

## 2019-02-21 LAB — NOVEL CORONAVIRUS, NAA: SARS-CoV-2, NAA: NOT DETECTED

## 2019-04-15 ENCOUNTER — Other Ambulatory Visit: Payer: Self-pay

## 2019-04-16 ENCOUNTER — Encounter: Payer: Self-pay | Admitting: Family Medicine

## 2019-04-16 ENCOUNTER — Ambulatory Visit (INDEPENDENT_AMBULATORY_CARE_PROVIDER_SITE_OTHER): Payer: BC Managed Care – PPO

## 2019-04-16 DIAGNOSIS — Z23 Encounter for immunization: Secondary | ICD-10-CM

## 2019-06-07 ENCOUNTER — Ambulatory Visit: Payer: BC Managed Care – PPO | Attending: Internal Medicine

## 2019-06-07 DIAGNOSIS — Z20822 Contact with and (suspected) exposure to covid-19: Secondary | ICD-10-CM | POA: Diagnosis not present

## 2019-06-09 LAB — NOVEL CORONAVIRUS, NAA: SARS-CoV-2, NAA: NOT DETECTED

## 2019-06-28 ENCOUNTER — Other Ambulatory Visit: Payer: Self-pay | Admitting: Family Medicine

## 2019-07-19 IMAGING — MG DIGITAL SCREENING BILATERAL MAMMOGRAM WITH TOMO AND CAD
8 series · 9 of 24 positions shown · non-contrast
Comparison: Previous exam(s).

CLINICAL DATA: Screening.

EXAM:
DIGITAL SCREENING BILATERAL MAMMOGRAM WITH TOMO AND CAD

[R MLO synth-2D]
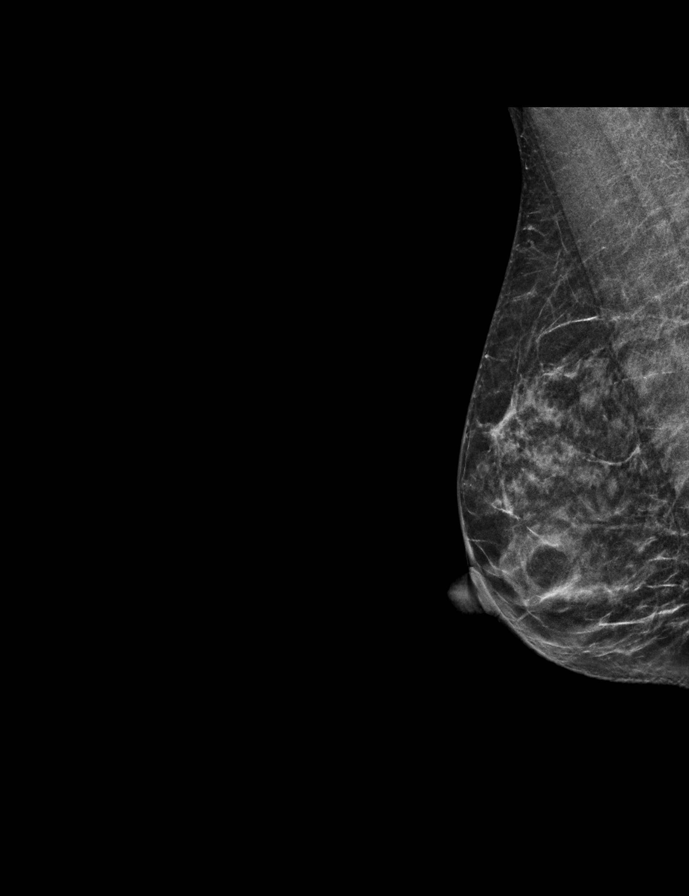

[R CC synth-2D]
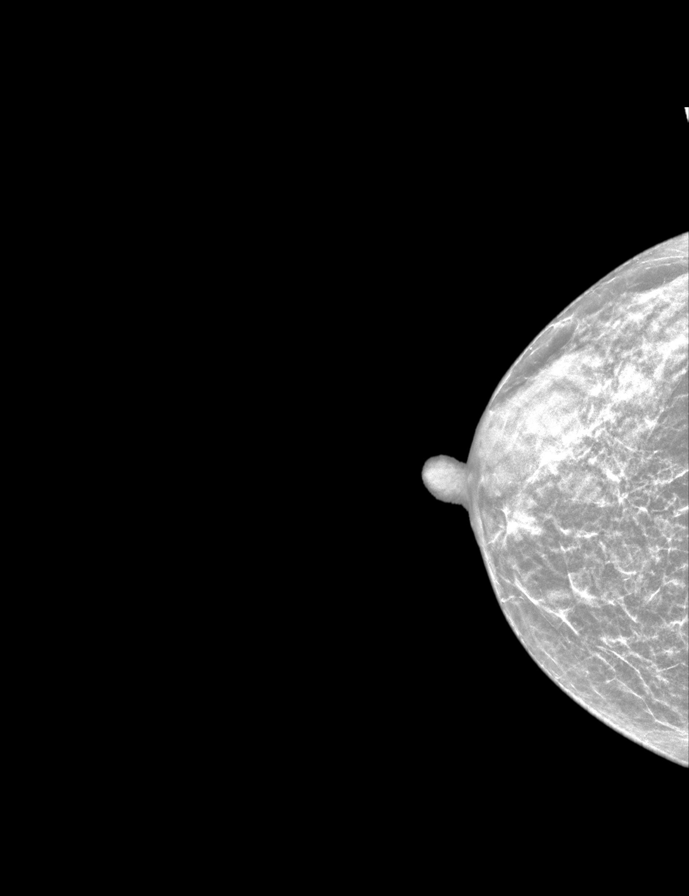

[L MLO synth-2D]
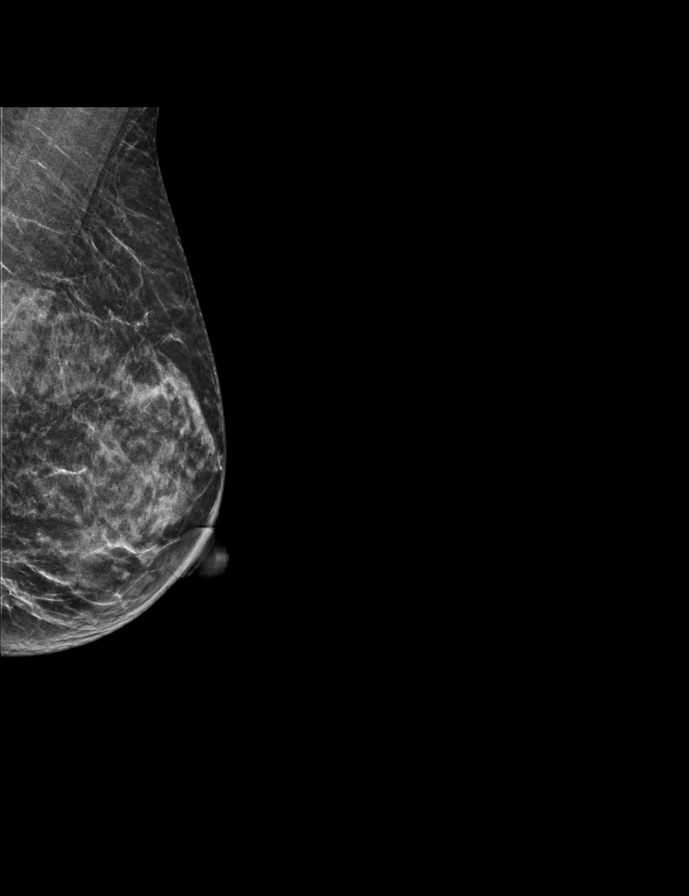

[L CC synth-2D]
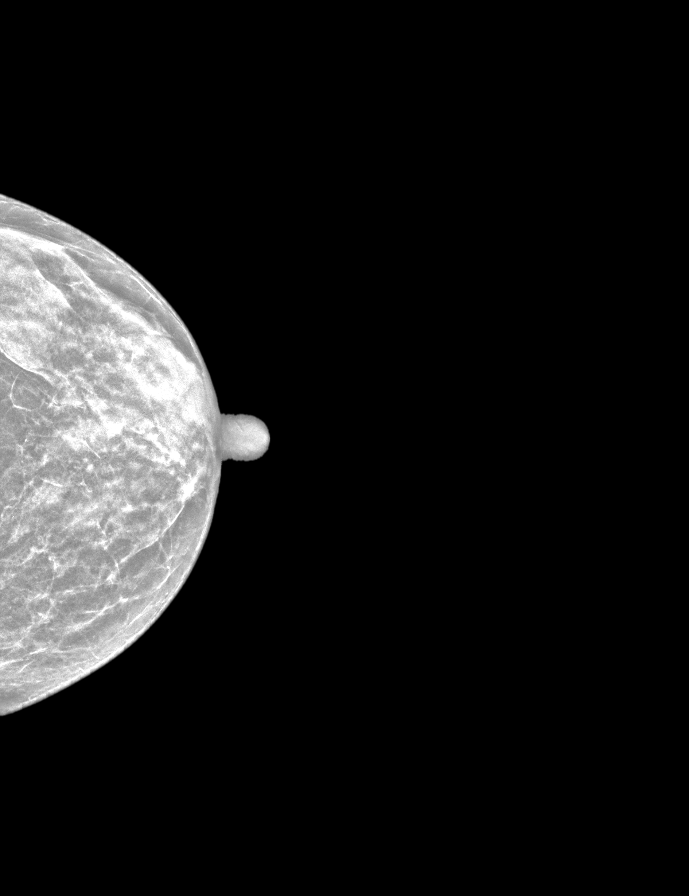

[L CC tomo · 2 of 35 frames shown]
[frame 12/35]
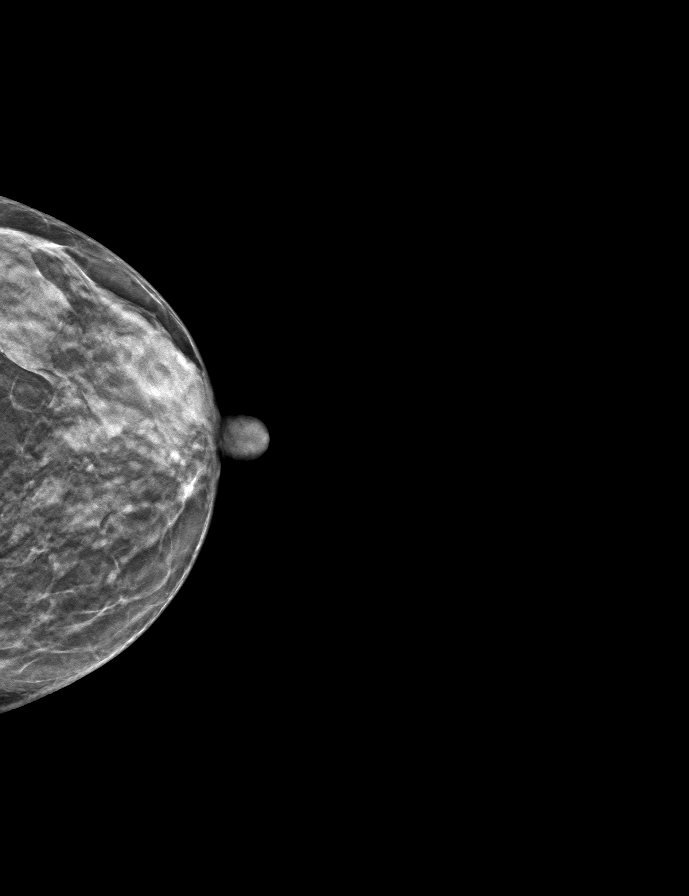
[frame 18/35]
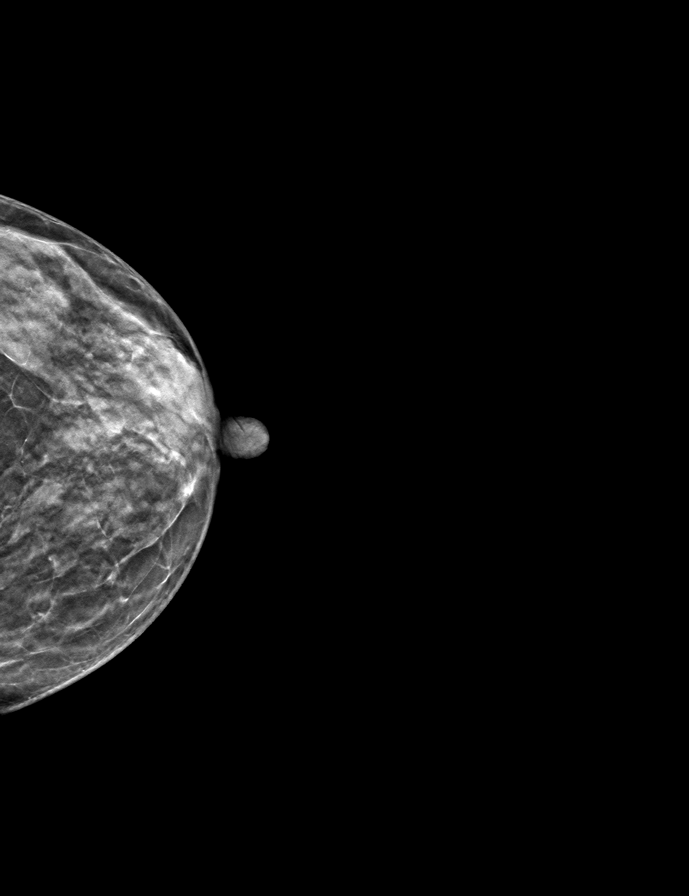

[R MLO tomo · tomo slice 20/39.0]
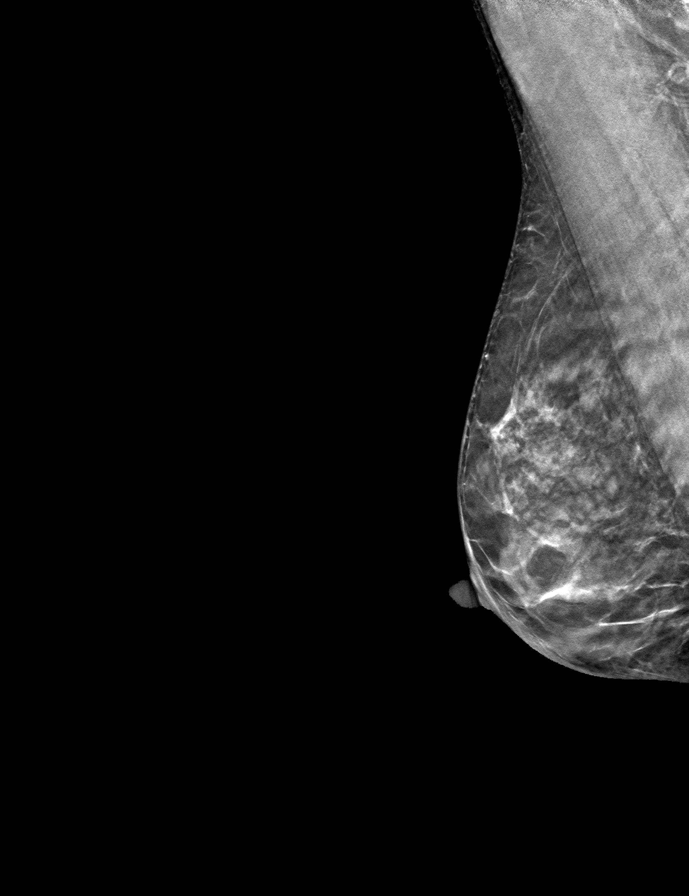

[R CC tomo · tomo slice 18/35.0]
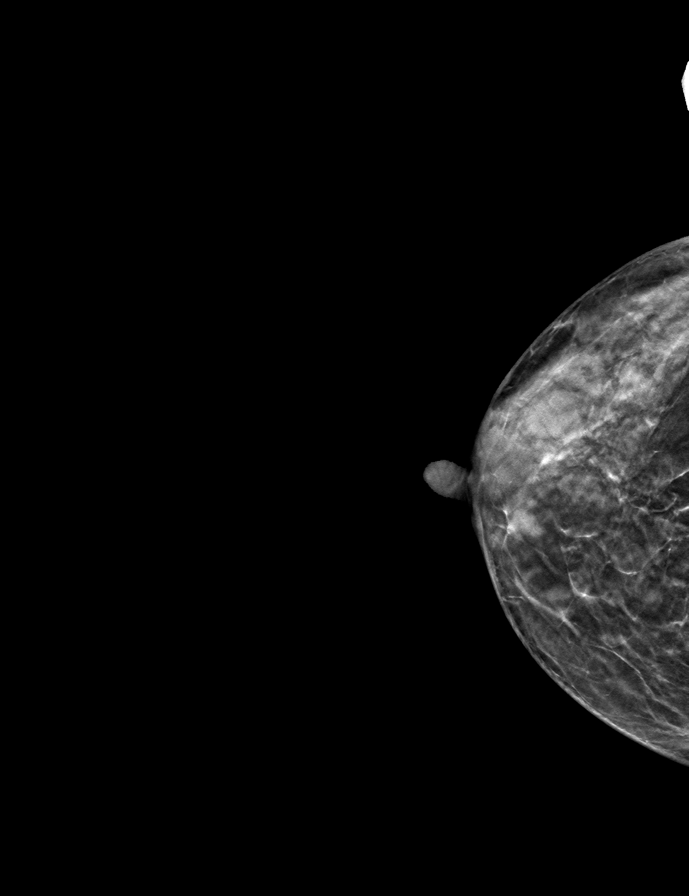

[L MLO tomo · tomo slice 20/39.0]
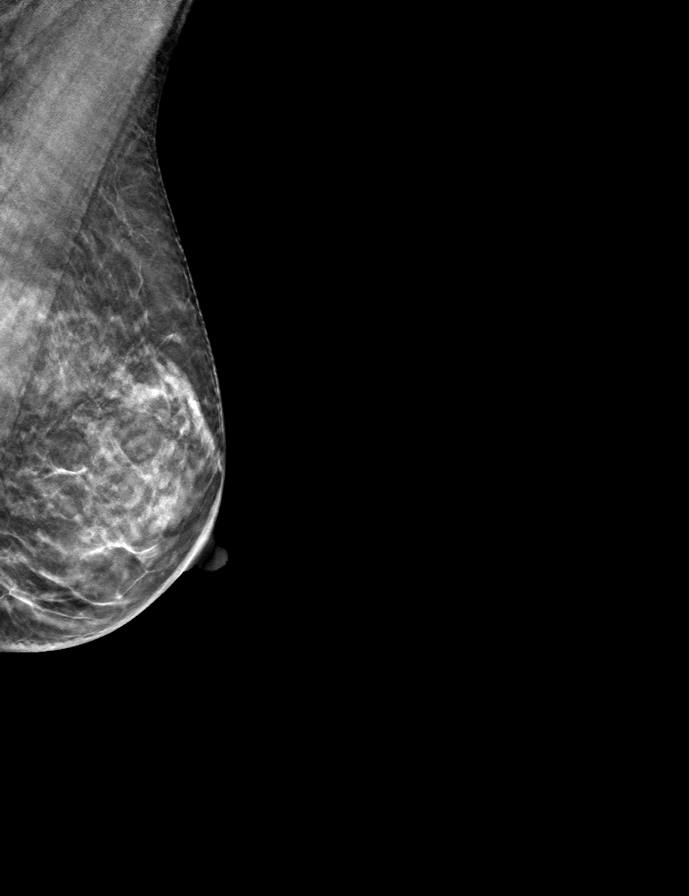

[9 of 24 positions shown; findings below may reference images not displayed]

ACR Breast Density Category c: The breast tissue is heterogeneously
dense, which may obscure small masses.
FINDINGS: In the right breast, a possible mass warrants further evaluation. In
the left breast, no findings suspicious for malignancy. Images were
processed with CAD.
IMPRESSION: Further evaluation is suggested for possible mass in the right
breast.

RECOMMENDATION:
Diagnostic mammogram and possibly ultrasound of the right breast.
(Code:[id number])

The patient will be contacted regarding the findings, and additional
imaging will be scheduled.

BI-RADS CATEGORY  0: Incomplete. Need additional imaging evaluation
and/or prior mammograms for comparison.

## 2019-07-26 ENCOUNTER — Other Ambulatory Visit: Payer: Self-pay

## 2019-07-26 ENCOUNTER — Ambulatory Visit: Payer: BC Managed Care – PPO | Attending: Internal Medicine

## 2019-07-26 DIAGNOSIS — Z23 Encounter for immunization: Secondary | ICD-10-CM

## 2019-07-26 NOTE — Progress Notes (Signed)
   Covid-19 Vaccination Clinic  Name:  Jordan Anderson    MRN: [id number] DOB: 1961-04-15  07/26/2019  Jordan Anderson was observed post Covid-19 immunization for 15 minutes without incident. She was provided with Vaccine Information Sheet and instruction to access the V-Safe system.   Jordan Anderson was instructed to call 911 with any severe reactions post vaccine: Marland Kitchen Difficulty breathing  . Swelling of face and throat  . A fast heartbeat  . A bad rash all over body  . Dizziness and weakness

## 2019-08-21 ENCOUNTER — Ambulatory Visit: Payer: BC Managed Care – PPO | Attending: Internal Medicine

## 2019-08-21 DIAGNOSIS — Z23 Encounter for immunization: Secondary | ICD-10-CM

## 2019-08-21 NOTE — Progress Notes (Signed)
   Covid-19 Vaccination Clinic  Name:  Jordan Anderson    MRN: [id number] DOB: 1960-08-13  08/21/2019  [name] was observed post Covid-19 immunization for 15 minutes without incident. She was provided with Vaccine Information Sheet and instruction to access the V-Safe system.   [name] was instructed to call 911 with any severe reactions post vaccine: Marland Kitchen Difficulty breathing  . Swelling of face and throat  . A fast heartbeat  . A bad rash all over body  . Dizziness and weakness   Immunizations Administered    Name Date Dose VIS Date Route   Pfizer COVID-19 Vaccine 08/21/2019  2:53 PM 0.3 mL 05/03/2019 Intramuscular   Manufacturer: [place]   Lot: [id number]   [place]: [id number]

## 2019-09-21 ENCOUNTER — Other Ambulatory Visit: Payer: Self-pay | Admitting: Family Medicine

## 2019-09-23 ENCOUNTER — Other Ambulatory Visit: Payer: Self-pay | Admitting: Family Medicine

## 2019-09-23 ENCOUNTER — Telehealth: Payer: Self-pay | Admitting: Family Medicine

## 2019-09-23 DIAGNOSIS — Z1231 Encounter for screening mammogram for malignant neoplasm of breast: Secondary | ICD-10-CM

## 2019-09-23 NOTE — Telephone Encounter (Signed)
Patient calling to get an appointment before her meds run out. She runs out of meds on May 8 and will need to have meds for her Levothyroxine 75 mg sent in for the days 8-17. She has an appt on 10-07-2019. Please Advise

## 2019-09-24 ENCOUNTER — Other Ambulatory Visit: Payer: Self-pay

## 2019-09-24 MED ORDER — LEVOTHYROXINE SODIUM 75 MCG PO TABS: ORAL_TABLET | ORAL | 0 refills | Status: DC

## 2019-09-24 NOTE — Telephone Encounter (Signed)
Rx sent in

## 2019-09-26 ENCOUNTER — Ambulatory Visit: Payer: BC Managed Care – PPO

## 2019-09-30 NOTE — Telephone Encounter (Deleted)
error 

## 2019-09-30 NOTE — Telephone Encounter (Signed)
error 

## 2019-10-07 ENCOUNTER — Ambulatory Visit (INDEPENDENT_AMBULATORY_CARE_PROVIDER_SITE_OTHER): Payer: BC Managed Care – PPO | Admitting: Family Medicine

## 2019-10-07 ENCOUNTER — Other Ambulatory Visit: Payer: Self-pay

## 2019-10-07 ENCOUNTER — Encounter: Payer: Self-pay | Admitting: Family Medicine

## 2019-10-07 VITALS — BP 110/70 | HR 66 | Temp 98.0°F | Ht 65.5 in | Wt 111.0 lb

## 2019-10-07 DIAGNOSIS — Z0001 Encounter for general adult medical examination with abnormal findings: Secondary | ICD-10-CM | POA: Diagnosis not present

## 2019-10-07 DIAGNOSIS — E039 Hypothyroidism, unspecified: Secondary | ICD-10-CM

## 2019-10-07 DIAGNOSIS — Z124 Encounter for screening for malignant neoplasm of cervix: Secondary | ICD-10-CM

## 2019-10-07 DIAGNOSIS — J309 Allergic rhinitis, unspecified: Secondary | ICD-10-CM | POA: Diagnosis not present

## 2019-10-07 LAB — URINALYSIS, ROUTINE W REFLEX MICROSCOPIC
Bilirubin Urine: NEGATIVE
Hgb urine dipstick: NEGATIVE
Ketones, ur: NEGATIVE
Leukocytes,Ua: NEGATIVE
Nitrite: NEGATIVE
RBC / HPF: NONE SEEN (ref 0–?)
Specific Gravity, Urine: 1.015 (ref 1.000–1.030)
Total Protein, Urine: NEGATIVE
Urine Glucose: NEGATIVE
Urobilinogen, UA: 0.2 (ref 0.0–1.0)
pH: 6.5 (ref 5.0–8.0)

## 2019-10-07 LAB — LIPID PANEL
Cholesterol: 197 mg/dL (ref 0–200)
HDL: 80.1 mg/dL (ref >39.00)
LDL Cholesterol: 85 mg/dL (ref 0–99)
NonHDL: 116.68
Total CHOL/HDL Ratio: 2
Triglycerides: 157 mg/dL — ABNORMAL HIGH (ref 0.0–149.0)
VLDL: 31.4 mg/dL (ref 0.0–40.0)

## 2019-10-07 LAB — CBC
HCT: 42.8 % (ref 36.0–46.0)
Hemoglobin: 14.6 g/dL (ref 12.0–15.0)
MCHC: 34.2 g/dL (ref 30.0–36.0)
MCV: 95.8 fl (ref 78.0–100.0)
Platelets: 232 10*3/uL (ref 150.0–400.0)
RBC: 4.47 Mil/uL (ref 3.87–5.11)
RDW: 14.1 % (ref 11.5–15.5)
WBC: 4.6 10*3/uL (ref 4.0–10.5)

## 2019-10-07 LAB — COMPREHENSIVE METABOLIC PANEL
ALT: 18 U/L (ref 0–35)
AST: 21 U/L (ref 0–37)
Albumin: 5 g/dL (ref 3.5–5.2)
Alkaline Phosphatase: 47 U/L (ref 39–117)
BUN: 19 mg/dL (ref 6–23)
CO2: 33 mEq/L — ABNORMAL HIGH (ref 19–32)
Calcium: 9.8 mg/dL (ref 8.4–10.5)
Chloride: 102 mEq/L (ref 96–112)
Creatinine, Ser: 0.75 mg/dL (ref 0.40–1.20)
GFR: 78.94 mL/min (ref >60.00)
Glucose, Bld: 86 mg/dL (ref 70–99)
Potassium: 4 mEq/L (ref 3.5–5.1)
Sodium: 141 mEq/L (ref 135–145)
Total Bilirubin: 0.9 mg/dL (ref 0.2–1.2)
Total Protein: 7.1 g/dL (ref 6.0–8.3)

## 2019-10-07 NOTE — Assessment & Plan Note (Signed)
Check TSH, CBC, C met.  Continue Synthroid 75 mcg daily.

## 2019-10-07 NOTE — Progress Notes (Signed)
Chief Complaint:  Jordan Anderson is a 59 y.o. female who presents today for her annual comprehensive physical exam.    Assessment/Plan:  Chronic Problems Addressed Today: Allergic rhinitis Stable.  Continue Flonase and Singulair as needed.  Hypothyroidism Check TSH, CBC, C met.  Continue Synthroid 75 mcg daily.  Preventative Healthcare: We will place referral for Pap smear.  Check CBC, CMP, TSH, UA.  Check lipid panel.  Patient Counseling(The following topics were reviewed and/or handout was given):  -Nutrition: Stressed importance of moderation in sodium/caffeine intake, saturated fat and cholesterol, caloric balance, sufficient intake of fresh fruits, vegetables, and fiber.  -Stressed the importance of regular exercise.   -Substance Abuse: Discussed cessation/primary prevention of tobacco, alcohol, or other drug use; driving or other dangerous activities under the influence; availability of treatment for abuse.   -Injury prevention: Discussed safety belts, safety helmets, smoke detector, smoking near bedding or upholstery.   -Sexuality: Discussed sexually transmitted diseases, partner selection, use of condoms, avoidance of unintended pregnancy and contraceptive alternatives.   -Dental health: Discussed importance of regular tooth brushing, flossing, and dental visits.  -Health maintenance and immunizations reviewed. Please refer to Health maintenance section.  Return to care in 1 year for next preventative visit.     Subjective:  HPI:  She has no acute complaints today.   Lifestyle Diet: Balanced. Plenty of fruits and vegetables.  Exercise: Likes to play tennis.   Depression screen PHQ 2/9 06/25/2018  Decreased Interest 0  Down, Depressed, Hopeless 0  PHQ - 2 Score 0    Health Maintenance Due  Topic Date Due  . HIV Screening  Never done     ROS: Per HPI, otherwise a complete review of systems was negative.   PMH:  The following were reviewed and  entered/updated in epic: Past Medical History:  Diagnosis Date  . Allergy   . Cataract   . Hypothyroidism   . Seasonal allergies    Patient Active Problem List   Diagnosis Date Noted  . Eczema 06/25/2018  . 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 History of colonic polyps 05/08/2009  . VARICOSE VEINS, LOWER EXTREMITIES 03/21/2008  . Hypothyroidism 01/23/2007  . Allergic rhinitis 01/23/2007   Past Surgical History:  Procedure Laterality Date  . ablation uterus  2005  . VEIN LIGATION Left 2012    Family History  Problem Relation Age of Onset  . Cancer Father   . Colon cancer Neg Hx   . Breast cancer Neg Hx   . Esophageal cancer Neg Hx   . Rectal cancer Neg Hx   . Stomach cancer Neg Hx     Medications- reviewed and updated Current Outpatient Medications  Medication Sig Dispense Refill  . fluocinonide (LIDEX) 0.05 % external solution 2 drops daily at bedtime left ear canal. 60 mL 3  . fluticasone (FLONASE) 50 MCG/ACT nasal spray 1 spray up each nostril each bedtime 16 g 6  . levothyroxine (SYNTHROID) 75 MCG tablet TAKE 1 TABLET BY MOUTH EVERY DAY 30 tablet 0  . montelukast (SINGULAIR) 10 MG tablet Take 1 tablet (10 mg total) by mouth at bedtime. 90 tablet 3  . Calcium Carb-Cholecalciferol (CALCIUM + D3) 600-200 MG-UNIT TABS Take by mouth daily.    Marland Kitchen conjugated estrogens (PREMARIN) vaginal cream Place vaginally daily. (Patient not taking: Reported on 10/07/2019) 42.5 g 12   No current facility-administered medications for this visit.    Allergies-reviewed and updated No Known Allergies  Social History   Socioeconomic History  .  Marital status: Married    Spouse name: Not on file  . Number of children: Not on file  . Years of education: Not on file  . Highest education level: Not on file  Occupational History  . Not on file  Tobacco Use  . Smoking status: Never Smoker  . Smokeless tobacco: Never Used  Substance and Sexual Activity  . Alcohol use: Yes     Alcohol/week: 10.0 standard drinks    Types: 10 Glasses of wine per week  . Drug use: No  . Sexual activity: Not on file  Other Topics Concern  . Not on file  Social History Narrative  . Not on file   Social Determinants of Health   Financial Resource Strain:   . Difficulty of Paying Living Expenses:   Food Insecurity:   . Worried About Charity fundraiser in the Last Year:   . Arboriculturist in the Last Year:   Transportation Needs:   . Film/video editor (Medical):   Marland Kitchen Lack of Transportation (Non-Medical):   Physical Activity:   . Days of Exercise per Week:   . Minutes of Exercise per Session:   Stress:   . Feeling of Stress :   Social Connections:   . Frequency of Communication with Friends and Family:   . Frequency of Social Gatherings with Friends and Family:   . Attends Religious Services:   . Active Member of Clubs or Organizations:   . Attends Archivist Meetings:   Marland Kitchen Marital Status:         Objective:  Physical Exam: BP 110/70 (BP Location: Left Arm, Patient Position: Sitting, Cuff Size: Normal)   Pulse 66   Temp 98 F (36.7 C) (Temporal)   Ht 5' 5.5" (1.664 m)   Wt 111 lb (50.3 kg)   SpO2 98%   BMI 18.19 kg/m   Body mass index is 18.19 kg/m. Wt Readings from Last 3 Encounters:  10/07/19 111 lb (50.3 kg)  10/12/18 116 lb (52.6 kg)  08/30/18 113 lb (51.3 kg)   Gen: NAD, resting comfortably HEENT: TMs normal bilaterally. OP clear. No thyromegaly noted.  CV: RRR with no murmurs appreciated Pulm: NWOB, CTAB with no crackles, wheezes, or rhonchi GI: Normal bowel sounds present. Soft, Nontender, Nondistended. MSK: no edema, cyanosis, or clubbing noted Skin: warm, dry Neuro: CN2-12 grossly intact. Strength 5/5 in upper and lower extremities. Reflexes symmetric and intact bilaterally.  Psych: Normal affect and thought content     Damyan Corne M. [name], MD 10/07/2019 2:03 PM

## 2019-10-07 NOTE — Assessment & Plan Note (Signed)
Stable.  Continue Flonase and Singulair as needed.

## 2019-10-07 NOTE — Patient Instructions (Signed)
It was very nice to see you today!  Keep up the good work!  We will check blood work and urine sample.  Come back in 1 year for your next physical, or sooner if needed.  Take care, Dr [name]  Please try these tips to maintain a healthy lifestyle:   Eat at least 3 REAL meals and 1-2 snacks per day.  Aim for no more than 5 hours between eating.  If you eat breakfast, please do so within one hour of getting up.    Each meal should contain half fruits/vegetables, one quarter protein, and one quarter carbs (no bigger than a computer mouse)   Cut down on sweet beverages. This includes juice, soda, and sweet tea.     Drink at least 1 glass of water with each meal and aim for at least 8 glasses per day   Exercise at least 150 minutes every week.    Preventive Care 60-29 Years Old, Female Preventive care refers to visits with your health care provider and lifestyle choices that can promote health and wellness. This includes:  A yearly physical exam. This may also be called an annual well check.  Regular dental visits and eye exams.  Immunizations.  Screening for certain conditions.  Healthy lifestyle choices, such as eating a healthy diet, getting regular exercise, not using drugs or products that contain nicotine and tobacco, and limiting alcohol use. What can I expect for my preventive care visit? Physical exam Your health care provider will check your:  Height and weight. This may be used to calculate body mass index (BMI), which tells if you are at a healthy weight.  Heart rate and blood pressure.  Skin for abnormal spots. Counseling Your health care provider may ask you questions about your:  Alcohol, tobacco, and drug use.  Emotional well-being.  Home and relationship well-being.  Sexual activity.  Eating habits.  Work and work Statistician.  Method of birth control.  Menstrual cycle.  Pregnancy history. What immunizations do I need?  Influenza  (flu) vaccine  This is recommended every year. Tetanus, diphtheria, and pertussis (Tdap) vaccine  You may need a Td booster every 10 years. Varicella (chickenpox) vaccine  You may need this if you have not been vaccinated. Zoster (shingles) vaccine  You may need this after age 108. Measles, mumps, and rubella (MMR) vaccine  You may need at least one dose of MMR if you were born in 1957 or later. You may also need a second dose. Pneumococcal conjugate (PCV13) vaccine  You may need this if you have certain conditions and were not previously vaccinated. Pneumococcal polysaccharide (PPSV23) vaccine  You may need one or two doses if you smoke cigarettes or if you have certain conditions. Meningococcal conjugate (MenACWY) vaccine  You may need this if you have certain conditions. Hepatitis A vaccine  You may need this if you have certain conditions or if you travel or work in places where you may be exposed to hepatitis A. Hepatitis B vaccine  You may need this if you have certain conditions or if you travel or work in places where you may be exposed to hepatitis B. Haemophilus influenzae type b (Hib) vaccine  You may need this if you have certain conditions. Human papillomavirus (HPV) vaccine  If recommended by your health care provider, you may need three doses over 6 months. You may receive vaccines as individual doses or as more than one vaccine together in one shot (combination vaccines). Talk with your  health care provider about the risks and benefits of combination vaccines. What tests do I need? Blood tests  Lipid and cholesterol levels. These may be checked every 5 years, or more frequently if you are over 45 years old.  Hepatitis C test.  Hepatitis B test. Screening  Lung cancer screening. You may have this screening every year starting at age 44 if you have a 30-pack-year history of smoking and currently smoke or have quit within the past 15 years.  Colorectal  cancer screening. All adults should have this screening starting at age 66 and continuing until age 57. Your health care provider may recommend screening at age 20 if you are at increased risk. You will have tests every 1-10 years, depending on your results and the type of screening test.  Diabetes screening. This is done by checking your blood sugar (glucose) after you have not eaten for a while (fasting). You may have this done every 1-3 years.  Mammogram. This may be done every 1-2 years. Talk with your health care provider about when you should start having regular mammograms. This may depend on whether you have a family history of breast cancer.  BRCA-related cancer screening. This may be done if you have a family history of breast, ovarian, tubal, or peritoneal cancers.  Pelvic exam and Pap test. This may be done every 3 years starting at age 53. Starting at age 79, this may be done every 5 years if you have a Pap test in combination with an HPV test. Other tests  Sexually transmitted disease (STD) testing.  Bone density scan. This is done to screen for osteoporosis. You may have this scan if you are at high risk for osteoporosis. Follow these instructions at home: Eating and drinking  Eat a diet that includes fresh fruits and vegetables, whole grains, lean protein, and low-fat dairy.  Take vitamin and mineral supplements as recommended by your health care provider.  Do not drink alcohol if: ? Your health care provider tells you not to drink. ? You are pregnant, may be pregnant, or are planning to become pregnant.  If you drink alcohol: ? Limit how much you have to 0-1 drink a day. ? Be aware of how much alcohol is in your drink. In the [place] one drink equals one 12 oz bottle of beer (355 mL), one 5 oz glass of wine (148 mL), or one 1 oz glass of hard liquor (44 mL). Lifestyle  Take daily care of your teeth and gums.  Stay active. Exercise for at least 30 minutes on 5 or more  days each week.  Do not use any products that contain nicotine or tobacco, such as cigarettes, e-cigarettes, and chewing tobacco. If you need help quitting, ask your health care provider.  If you are sexually active, practice safe sex. Use a condom or other form of birth control (contraception) in order to prevent pregnancy and STIs (sexually transmitted infections).  If told by your health care provider, take low-dose aspirin daily starting at age 62. What's next?  Visit your health care provider once a year for a well check visit.  Ask your health care provider how often you should have your eyes and teeth checked.  Stay up to date on all vaccines. This information is not intended to replace advice given to you by your health care provider. Make sure you discuss any questions you have with your health care provider. Document Revised: 01/18/2018 Document Reviewed: 01/18/2018 Elsevier Patient Education  [phone]  [employer].

## 2019-10-08 LAB — TSH: TSH: 1.51 u[IU]/mL (ref 0.35–4.50)

## 2019-10-09 NOTE — Progress Notes (Signed)
Please inform patient of the following:  Blood work is all NORMAL. Do not need to make any changes to her treatment plan at this time. Would like for her to keep up the good work and we can recheck in a year.  Algis Greenhouse. [name], MD 10/09/2019 9:19 AM

## 2019-10-18 ENCOUNTER — Other Ambulatory Visit: Payer: Self-pay

## 2019-10-18 DIAGNOSIS — E039 Hypothyroidism, unspecified: Secondary | ICD-10-CM

## 2019-10-18 MED ORDER — LEVOTHYROXINE SODIUM 75 MCG PO TABS: ORAL_TABLET | ORAL | 3 refills | Status: DC

## 2019-12-27 ENCOUNTER — Ambulatory Visit
Admission: RE | Admit: 2019-12-27 | Discharge: 2019-12-27 | Disposition: A | Discharge status: Home / Self Care | Payer: BC Managed Care – PPO | Source: Ambulatory Visit | Attending: Family Medicine | Admitting: Family Medicine

## 2019-12-27 ENCOUNTER — Other Ambulatory Visit: Payer: Self-pay

## 2019-12-27 DIAGNOSIS — Z1231 Encounter for screening mammogram for malignant neoplasm of breast: Secondary | ICD-10-CM | POA: Diagnosis not present

## 2020-01-15 ENCOUNTER — Other Ambulatory Visit: Payer: Self-pay | Admitting: Family Medicine

## 2020-01-15 DIAGNOSIS — E039 Hypothyroidism, unspecified: Secondary | ICD-10-CM

## 2020-02-03 ENCOUNTER — Other Ambulatory Visit (HOSPITAL_COMMUNITY)
Admission: RE | Admit: 2020-02-03 | Discharge: 2020-02-03 | Disposition: A | Discharge status: Home / Self Care | Payer: BC Managed Care – PPO | Source: Ambulatory Visit | Attending: Obstetrics & Gynecology | Admitting: Obstetrics & Gynecology

## 2020-02-03 ENCOUNTER — Ambulatory Visit (INDEPENDENT_AMBULATORY_CARE_PROVIDER_SITE_OTHER): Payer: BC Managed Care – PPO | Admitting: Obstetrics & Gynecology

## 2020-02-03 ENCOUNTER — Other Ambulatory Visit: Payer: Self-pay

## 2020-02-03 ENCOUNTER — Encounter: Payer: Self-pay | Admitting: Obstetrics & Gynecology

## 2020-02-03 VITALS — BP 130/94 | HR 70 | Ht 65.0 in | Wt 109.0 lb

## 2020-02-03 DIAGNOSIS — Z780 Asymptomatic menopausal state: Secondary | ICD-10-CM

## 2020-02-03 DIAGNOSIS — Z01411 Encounter for gynecological examination (general) (routine) with abnormal findings: Secondary | ICD-10-CM | POA: Insufficient documentation

## 2020-02-03 DIAGNOSIS — N898 Other specified noninflammatory disorders of vagina: Secondary | ICD-10-CM

## 2020-02-03 DIAGNOSIS — Z23 Encounter for immunization: Secondary | ICD-10-CM | POA: Diagnosis not present

## 2020-02-03 DIAGNOSIS — R636 Underweight: Secondary | ICD-10-CM | POA: Diagnosis not present

## 2020-02-03 NOTE — Patient Instructions (Signed)

## 2020-02-03 NOTE — Progress Notes (Signed)
Subjective:     Jordan Anderson is a 59 y.o. female here for a routine exam. Pt heard me speak at the 2021 [place] tennis awareness event (lives near [place]).  Pt used to see dr. Cristi Loron who retired.  No complaints today.  Needs pap smear and Dexa.  Pt has w/u form PCP to weight loss.  All negative per patient report.     Gynecologic History No LMP recorded. Patient has had an ablation. Contraception: post menopausal status Last Pap: 7 years ago--normal per patinet. No nx abnormal pap. Last mammogram: august 2021. Results were: normal  Obstetric History OB History  Gravida Para Term Preterm AB Living  3 2 2   1 2   SAB TAB Ectopic Multiple Live Births    1          # Outcome Date GA Lbr Len/2nd Weight Sex Delivery Anes PTL Lv  3 TAB           2 Term           1 Term              The following portions of the patient's history were reviewed and updated as appropriate: allergies, current medications, past family history, past medical history, past social history, past surgical history and problem list.  Review of Systems Pertinent items noted in HPI and remainder of comprehensive ROS otherwise negative.    Objective:      Vitals:   02/03/20 0900  BP: (!) 130/94  Pulse: 70  Weight: 109 lb (49.4 kg)  Height: 5\' 5"  (1.651 m)   Vitals:  WNL General appearance: alert, cooperative and no distress  HEENT: Normocephalic, without obvious abnormality, atraumatic Eyes: negative Throat: lips, mucosa, and tongue normal; teeth and gums normal  Respiratory: Clear to auscultation bilaterally  CV: Regular rate and rhythm  Breasts:  Normal appearance, no masses or tenderness, no nipple retraction or dimpling  GI: Soft, non-tender; bowel sounds normal; no masses,  no organomegaly  GU: External Genitalia:  Tanner V, no lesion Urethra:  No prolapse, small benign sessile lesion just inferior to urethra (see picture).  Appear benign.  Vagina: Pink, normal rugae, no blood or  discharge  Cervix: No CMT, no lesion  Uterus:  Normal size and contour, non tender  Adnexa: Normal, no masses, non tender  Musculoskeletal: No edema, redness or tenderness in the calves or thighs  Skin: No lesions or rash  Lymphatic: Axillary adenopathy: none     Psychiatric: Normal mood and behavior          Assessment:    Healthy female exam.    Plan:    1.  Pap with co testing 2.  Yearly Mammo 3.  Dexa 4.  Reviewed calcium intake and Vit d. 5. Vulvar exams and watch area near urethra.  If changes or bleeds, she should make appt.

## 2020-02-04 LAB — CYTOLOGY - PAP
Comment: NEGATIVE
Diagnosis: NEGATIVE
High risk HPV: NEGATIVE

## 2020-02-05 ENCOUNTER — Ambulatory Visit (INDEPENDENT_AMBULATORY_CARE_PROVIDER_SITE_OTHER): Payer: BC Managed Care – PPO

## 2020-02-05 ENCOUNTER — Other Ambulatory Visit: Payer: Self-pay

## 2020-02-05 DIAGNOSIS — R636 Underweight: Secondary | ICD-10-CM

## 2020-02-05 DIAGNOSIS — Z780 Asymptomatic menopausal state: Secondary | ICD-10-CM | POA: Diagnosis not present

## 2020-02-05 DIAGNOSIS — E039 Hypothyroidism, unspecified: Secondary | ICD-10-CM | POA: Diagnosis not present

## 2020-02-05 DIAGNOSIS — E2839 Other primary ovarian failure: Secondary | ICD-10-CM | POA: Diagnosis not present

## 2020-02-05 DIAGNOSIS — M85851 Other specified disorders of bone density and structure, right thigh: Secondary | ICD-10-CM | POA: Diagnosis not present

## 2020-02-13 ENCOUNTER — Encounter: Payer: Self-pay | Admitting: Obstetrics & Gynecology

## 2020-02-13 DIAGNOSIS — M8580 Other specified disorders of bone density and structure, unspecified site: Secondary | ICD-10-CM | POA: Insufficient documentation

## 2020-02-13 DIAGNOSIS — Z780 Asymptomatic menopausal state: Secondary | ICD-10-CM | POA: Insufficient documentation

## 2020-03-07 ENCOUNTER — Ambulatory Visit: Payer: BC Managed Care – PPO | Attending: Internal Medicine

## 2020-03-07 DIAGNOSIS — Z23 Encounter for immunization: Secondary | ICD-10-CM

## 2020-03-07 NOTE — Progress Notes (Signed)
   Covid-19 Vaccination Clinic  Name:  Alysen Smylie    MRN: [id number] DOB: [birth year]  03/07/2020  Ms. Junious was observed post Covid-19 immunization for 15 minutes without incident. She was provided with Vaccine Information Sheet and instruction to access the V-Safe system.   Ms. Paules was instructed to call 911 with any severe reactions post vaccine: Marland Kitchen Difficulty breathing  . Swelling of face and throat  . A fast heartbeat  . A bad rash all over body  . Dizziness and weakness

## 2020-04-07 DIAGNOSIS — F4323 Adjustment disorder with mixed anxiety and depressed mood: Secondary | ICD-10-CM | POA: Diagnosis not present

## 2020-04-20 ENCOUNTER — Telehealth: Payer: Self-pay | Admitting: *Deleted

## 2020-04-20 NOTE — Telephone Encounter (Signed)
Left patient a message to call and reschedule 06/15/2020 appointment with Dr. Gala Romney for a different day, due to Dr. Roselie Awkward working Dr. Roseanne Kaufman schedule on 06/15/2020.

## 2020-04-21 DIAGNOSIS — F4323 Adjustment disorder with mixed anxiety and depressed mood: Secondary | ICD-10-CM | POA: Diagnosis not present

## 2020-04-28 ENCOUNTER — Encounter: Payer: Self-pay | Admitting: Sports Medicine

## 2020-04-28 ENCOUNTER — Ambulatory Visit: Payer: Self-pay

## 2020-04-28 ENCOUNTER — Other Ambulatory Visit: Payer: Self-pay

## 2020-04-28 ENCOUNTER — Ambulatory Visit (INDEPENDENT_AMBULATORY_CARE_PROVIDER_SITE_OTHER): Payer: BC Managed Care – PPO | Admitting: Sports Medicine

## 2020-04-28 VITALS — BP 104/72 | Ht 65.25 in | Wt 108.0 lb

## 2020-04-28 DIAGNOSIS — G8929 Other chronic pain: Secondary | ICD-10-CM

## 2020-04-28 DIAGNOSIS — M25511 Pain in right shoulder: Secondary | ICD-10-CM | POA: Diagnosis not present

## 2020-04-28 NOTE — Progress Notes (Signed)
    SUBJECTIVE:   CHIEF COMPLAINT / HPI:   Right Shoulder Pain Ms Jordan Anderson is a pleasant 59y/o female who is a known patient at this practice who presents today for ongoing right shoulder pain for the past several months. She has been very active with yard work and tennis and admits to rarely "giving her shoulder a break". She also picks up her grandchildren who are 3 and 5 and plays with them by lifting them up often. She had no known trauma or inciting event and states it does not hurt all the time just with certain activities. She does not wake up from sleep due to pain but avoids laying on her right side. She recently tried to restart some rotator cuff exercises she had received in the past but it was too painful. She uses Ibuprofen and tigerbalm occasionally and it does help.  PERTINENT  PMH / PSH: Osteopenia  OBJECTIVE:   BP 104/72   Ht 5' 5.25" (1.657 m)   Wt 108 lb (49 kg)   BMI 17.83 kg/m   [facility] 04/28/2020  Frequency of aerobic exercise (# of days/week) 5  Average time in minutes 75  Frequency of strengthening activities (# of days/week) 0    Shoulder, Right: No evidence of bony deformity, asymmetry, or muscle atrophy; No tenderness over long head of biceps (bicipital groove). No TTP at [facility] joint. Full active and passive range of motion (180 flex Huel Cote /150Abd /90ER /70IR), Thumb to T12 without significant tenderness. Strength 5/5 throughout. Abnormal scapular function observed right scapular protrusion on the right when compared to the left. Sensation intact. Peripheral pulses intact.  Special Tests:   - Crossarm test: NEG - Jobe test: NEG   - Hawkins: NEG   - Neer test: NEG   - Belly press test: NEG   - Speeds test: NEG  Complete Ultrasound of Right Shoulder Biceps tendon: No biceps tendon seen within the bicipital groove instead a large area of hypoechoic fluid accumulation seen with attachment of the proximal biceps seen  lower within the bicipital groove on the humerus. Pec major tendon: well visualized and inserting at the greater head of the humerus Subscapularis: well visualized with no tears, no fiber disruption, no hypoechoic changes. Does have spurring of the humeral head below subscap attachment at the footplate. AC joint: well visualized, no giser sign present, spurring seen Infraspinatus: well visualized with no disruption of the fibers, no hypoechoic changes Supraspinatus: well visualized with signs of no tear or fiber disruption and no hypoechoic changes superior to the tendon.  Posterior glenohumeral joint: well visualized Interpretation: Overall, small area of hypoechoic fluid accumulation and fiber disruption seen within the bicipital groove at the proximal biceps consistent partial proximal biceps tendon tear.  Ultrasound and interpretation by Dr. Garlan Fillers and Wolfgang Phoenix. 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 - Scapular exercises along with rotator cuff exercises - Avoid aggravating activities - Use Ibuprofen as needed - F/u in 6 weeks     Nuala Alpha, DO PGY-4, Sports Medicine Fellow [place]  I observed and examined the patient with the [facility] resident and agree with assessment and plan.  Note reviewed and modified by me. Ila Mcgill, MD

## 2020-04-28 NOTE — Assessment & Plan Note (Addendum)
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 - Scapular exercises along with rotator cuff exercises - Avoid aggravating activities - Use Ibuprofen as needed - F/u in 6 weeks

## 2020-04-28 NOTE — Patient Instructions (Signed)
It was great to see you today!  We discussed the following for your right shoulder pain: -You have shoulder impingement and scapular dysfunction which is causing your shoulder to rotate forward. -Do the exercises we gave you to try and reverse this. -You also have a bicep tendon tear but I don't think this is bothering you. -Follow up with [place] in 4-6 weeks so we can re-check your shoulder.  Call us with any questions in the meantime!

## 2020-06-02 ENCOUNTER — Ambulatory Visit: Payer: BC Managed Care – PPO | Admitting: Sports Medicine

## 2020-06-15 ENCOUNTER — Ambulatory Visit: Payer: BC Managed Care – PPO | Admitting: Obstetrics & Gynecology

## 2020-06-22 ENCOUNTER — Ambulatory Visit: Payer: BC Managed Care – PPO | Admitting: Obstetrics & Gynecology

## 2020-06-25 ENCOUNTER — Ambulatory Visit: Payer: BC Managed Care – PPO | Admitting: Sports Medicine

## 2020-06-29 ENCOUNTER — Other Ambulatory Visit: Payer: Self-pay

## 2020-06-29 ENCOUNTER — Ambulatory Visit (INDEPENDENT_AMBULATORY_CARE_PROVIDER_SITE_OTHER): Payer: BC Managed Care – PPO | Admitting: Obstetrics & Gynecology

## 2020-06-29 ENCOUNTER — Encounter: Payer: Self-pay | Admitting: Obstetrics & Gynecology

## 2020-06-29 VITALS — BP 142/84 | HR 59 | Ht 65.0 in | Wt 112.0 lb

## 2020-06-29 DIAGNOSIS — I10 Essential (primary) hypertension: Secondary | ICD-10-CM

## 2020-06-29 DIAGNOSIS — Z780 Asymptomatic menopausal state: Secondary | ICD-10-CM | POA: Diagnosis not present

## 2020-06-29 NOTE — Progress Notes (Signed)
   Subjective:    Patient ID: Jordan Anderson, female    DOB: 01/07/1961, 60 y.o.   MRN: [id number]  HPI  60 yo female presents for f/u of sessile "tag" from lower urethra.  Pt has had no bleeding or irritation. Pt has not taken any meds or creams.   Review of Systems  Constitutional: Negative.   Respiratory: Negative.   Cardiovascular: Negative.   Gastrointestinal: Negative.   Genitourinary: Negative.        Objective:   Physical Exam Vitals reviewed.  Constitutional:      General: She is not in acute distress.    Appearance: She is well-developed and well-nourished.  HENT:     Head: Normocephalic and atraumatic.  Eyes:     Conjunctiva/sclera: Conjunctivae normal.  Cardiovascular:     Rate and Rhythm: Normal rate.  Pulmonary:     Effort: Pulmonary effort is normal.  Musculoskeletal:        General: No edema.  Skin:    General: Skin is warm and dry.  Neurological:     Mental Status: She is alert and oriented to person, place, and time.  Psychiatric:        Mood and Affect: Mood and affect normal.    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 1.  Urethra and vulva--no change; benign changes. 2.  Ca and Vit D; handout given; discussed osteopenia vs osteoporosis 3.  Pap up to date 4.  Mammogram up to date. 5.  Mildly hypertensive.  Pt has PCP and to follow up with him.    20 minutes spent with patient review or records and tests, counseling and documentation

## 2020-07-02 ENCOUNTER — Ambulatory Visit (INDEPENDENT_AMBULATORY_CARE_PROVIDER_SITE_OTHER): Payer: BC Managed Care – PPO | Admitting: Sports Medicine

## 2020-07-02 ENCOUNTER — Other Ambulatory Visit: Payer: Self-pay

## 2020-07-02 VITALS — BP 112/78 | Ht 65.0 in | Wt 108.0 lb

## 2020-07-02 DIAGNOSIS — M25511 Pain in right shoulder: Secondary | ICD-10-CM | POA: Diagnosis not present

## 2020-07-02 DIAGNOSIS — G8929 Other chronic pain: Secondary | ICD-10-CM | POA: Diagnosis not present

## 2020-07-02 NOTE — Progress Notes (Signed)
Office Visit Note   Patient: Jordan Anderson           Date of Birth: 02/16/1961           MRN: [id number] Visit Date: 07/02/2020 Requested by: Vivi Barrack, MD [address] 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 At previous encounter, ultrasound demonstrated a proximal biceps irritation, with intact rotator cuff. Patient was given biceps and rotator exercises, and states that she has been incredibly diligent with these activities. Her pain was never severe enough for her to have to stop playing tennis, which she very much likes to do. Currently, she says that her pain is dramatically better, and she is incredibly happy with how she is recovered thus far. She is curious to know if there is anything else she can do to further improve her recovery, but states that there are no activities she is now unable to do because of her shoulder.              ROS:   All other systems were reviewed and are negative.  Objective: Vital Signs: BP 112/78   Ht 5\' 5"  (1.651 m)   Wt 108 lb (49 kg)   BMI 17.97 kg/m   Physical Exam:  General:  Alert and oriented, in no acute distress. Pulm:  Breathing unlabored. Psy:  Normal mood, congruent affect. Skin: Right shoulder with no bruising, rashes, or erythema. Overlying skin intact. Right shoulder Exam:  Inspection: Symmetric muscle mass, no atrophy or deformity, no scars. Palpation: No tenderness to palpation over and around the acromion, over the [facility] joint or biceps insertion.  Range of motion: Full range of motion in forward flexion, abduction and extension.  Full External rotation to 90 degrees. Apley scratch symmetrical at approximately level of T5 vertebrae.  Rotator cuff testing:  Full strength and no pain with empty can (supraspinatus).  External rotation with full strength and no  pain. Full strength and no pain with Napoleon and lift off.  AC joint testing: No AC tenderness to palpation, negative scarf test. Negative active compression test.  Biceps testing: Negative speeds, negative Yergason.  Labral testing: O'Brien's/speeds with full strength and very mild pain. Crank test: Negative  Impingement testing: Mild pain with Hawkins  Strength testing:  5 out of 5 strength with shoulder abduction (C5), wrist extension (C6), wrist flexion (C7), grip strength (C8), and finger abduction (T1).  Sensation: Intact to light touch throughout bilateral upper extremities.   Brisk distal capillary refill.   Imaging: No results found.  Assessment & Plan: 60 year old female presenting to clinic today to follow-up on previously diagnosed proximal biceps tendinopathy. Patient has made tremendous strides in her recovery, and is no longer held back by her pain. She is happily remaining on the tennis courts. -Encouraged to continue her rehabilitation exercises, though she may drop down in frequency as her symptoms resolved. -Discussed tension of her tennis racquet strings, as too high of attention can increase the vibration she experiences in her arm. -Encouraged to seek a tennis coach to help assure proper form. -Given patient's significant improvement, no further follow-up needed unless her symptoms worsen. -Patient was happy at the completion of today's visit. She had no further questions or concerns.     Procedures: No procedures performed     I observed and examined the patient with the  [facility] resident and agree with assessment and plan.  Note reviewed and modified by me. Ila Mcgill, MD

## 2020-07-10 ENCOUNTER — Other Ambulatory Visit: Payer: Self-pay | Admitting: Family Medicine

## 2020-07-10 DIAGNOSIS — E039 Hypothyroidism, unspecified: Secondary | ICD-10-CM

## 2021-01-06 ENCOUNTER — Other Ambulatory Visit: Payer: Self-pay | Admitting: Family Medicine

## 2021-01-06 DIAGNOSIS — E039 Hypothyroidism, unspecified: Secondary | ICD-10-CM

## 2021-02-22 ENCOUNTER — Other Ambulatory Visit: Payer: Self-pay | Admitting: Family Medicine

## 2021-02-22 DIAGNOSIS — Z1231 Encounter for screening mammogram for malignant neoplasm of breast: Secondary | ICD-10-CM

## 2021-02-23 ENCOUNTER — Ambulatory Visit
Admission: RE | Admit: 2021-02-23 | Discharge: 2021-02-23 | Disposition: A | Discharge status: Home / Self Care | Payer: BC Managed Care – PPO | Source: Ambulatory Visit | Attending: Family Medicine | Admitting: Family Medicine

## 2021-02-23 ENCOUNTER — Other Ambulatory Visit: Payer: Self-pay

## 2021-02-23 DIAGNOSIS — Z1231 Encounter for screening mammogram for malignant neoplasm of breast: Secondary | ICD-10-CM

## 2021-03-03 ENCOUNTER — Ambulatory Visit (INDEPENDENT_AMBULATORY_CARE_PROVIDER_SITE_OTHER): Payer: BC Managed Care – PPO | Admitting: Dermatology

## 2021-03-03 ENCOUNTER — Other Ambulatory Visit: Payer: Self-pay

## 2021-03-03 ENCOUNTER — Encounter: Payer: Self-pay | Admitting: Dermatology

## 2021-03-03 DIAGNOSIS — L219 Seborrheic dermatitis, unspecified: Secondary | ICD-10-CM | POA: Diagnosis not present

## 2021-03-03 DIAGNOSIS — Z1283 Encounter for screening for malignant neoplasm of skin: Secondary | ICD-10-CM

## 2021-03-03 DIAGNOSIS — L821 Other seborrheic keratosis: Secondary | ICD-10-CM

## 2021-03-03 NOTE — Patient Instructions (Signed)
Over the counter anti-fungal & hydrocortisone cream

## 2021-03-19 ENCOUNTER — Encounter: Payer: Self-pay | Admitting: Dermatology

## 2021-03-19 NOTE — Progress Notes (Signed)
   New Patient   Subjective  Jordan Anderson is a 60 y.o. female who presents for the following: Annual Exam (Back- "dont remember it being there").  New growth on back, general skin examination Location:  Duration:  Quality:  Associated Signs/Symptoms: Modifying Factors:  Severity:  Timing: Context:    The following portions of the chart were reviewed this encounter and updated as appropriate:  Tobacco  Allergies  Meds  Problems  Med Hx  Surg Hx  Fam Hx      Objective  Well appearing patient in no apparent distress; mood and affect are within normal limits. Torso - Posterior (Back) Full body skin examination: No atypical pigmented lesions or nonmelanoma skin cancer.  Left Upper Back 5 mm brown textured flattopped papule; dermoscopy typical  Left Cymba, Left Inferior Crus of Antihelix At the conclusion of the visit I asked patient if she had any other skin issues and she mentioned some scale and itching in her ears; examination compatible with seborrheic dermatitis.  Nurse had already left the room so exact site of involvement may not be accurate.  I suggested she obtain over-the-counter clotrimazole lotion and 1% hydrocortisone ointment and apply these daily for 2 weeks.  To contact me if this is not helpful.    A full examination was performed including scalp, head, eyes, ears, nose, lips, neck, chest, axillae, abdomen, back, buttocks, bilateral upper extremities, bilateral lower extremities, hands, feet, fingers, toes, fingernails, and toenails. All findings within normal limits unless otherwise noted below.  Areas beneath undergarments not fully examined.   Assessment & Plan  Encounter for screening for malignant neoplasm of skin Torso - Posterior (Back)  Annual skin examination, patient encouraged to self examine twice annually.  Continue ultraviolet protection.  Seborrheic keratosis Left Upper Back  I offered to take confirmatory biopsy but patient  content to leave lesion unless there is clinical change.  Seborrheic dermatitis Left Inferior Crus of Antihelix; Left Cymba

## 2021-04-05 ENCOUNTER — Other Ambulatory Visit: Payer: Self-pay | Admitting: Family Medicine

## 2021-04-05 DIAGNOSIS — E039 Hypothyroidism, unspecified: Secondary | ICD-10-CM

## 2021-07-14 ENCOUNTER — Ambulatory Visit: Payer: BC Managed Care – PPO | Admitting: Dermatology

## 2021-07-22 ENCOUNTER — Ambulatory Visit (INDEPENDENT_AMBULATORY_CARE_PROVIDER_SITE_OTHER): Payer: BC Managed Care – PPO | Admitting: Family Medicine

## 2021-07-22 ENCOUNTER — Other Ambulatory Visit: Payer: Self-pay | Admitting: Family Medicine

## 2021-07-22 ENCOUNTER — Ambulatory Visit (INDEPENDENT_AMBULATORY_CARE_PROVIDER_SITE_OTHER): Payer: BC Managed Care – PPO

## 2021-07-22 ENCOUNTER — Other Ambulatory Visit: Payer: Self-pay

## 2021-07-22 ENCOUNTER — Encounter: Payer: Self-pay | Admitting: Family Medicine

## 2021-07-22 VITALS — BP 139/86 | HR 67 | Temp 97.7°F | Ht 65.75 in | Wt 111.4 lb

## 2021-07-22 DIAGNOSIS — Z13220 Encounter for screening for lipoid disorders: Secondary | ICD-10-CM | POA: Diagnosis not present

## 2021-07-22 DIAGNOSIS — Z0001 Encounter for general adult medical examination with abnormal findings: Secondary | ICD-10-CM

## 2021-07-22 DIAGNOSIS — L309 Dermatitis, unspecified: Secondary | ICD-10-CM

## 2021-07-22 DIAGNOSIS — R739 Hyperglycemia, unspecified: Secondary | ICD-10-CM

## 2021-07-22 DIAGNOSIS — Z23 Encounter for immunization: Secondary | ICD-10-CM

## 2021-07-22 DIAGNOSIS — E039 Hypothyroidism, unspecified: Secondary | ICD-10-CM

## 2021-07-22 DIAGNOSIS — J309 Allergic rhinitis, unspecified: Secondary | ICD-10-CM

## 2021-07-22 DIAGNOSIS — R002 Palpitations: Secondary | ICD-10-CM

## 2021-07-22 LAB — CBC
HCT: 41.3 % (ref 36.0–46.0)
Hemoglobin: 14 g/dL (ref 12.0–15.0)
MCHC: 33.9 g/dL (ref 30.0–36.0)
MCV: 94.1 fl (ref 78.0–100.0)
Platelets: 230 10*3/uL (ref 150.0–400.0)
RBC: 4.39 Mil/uL (ref 3.87–5.11)
RDW: 13.7 % (ref 11.5–15.5)
WBC: 3.9 10*3/uL — ABNORMAL LOW (ref 4.0–10.5)

## 2021-07-22 LAB — COMPREHENSIVE METABOLIC PANEL
ALT: 13 U/L (ref 0–35)
AST: 19 U/L (ref 0–37)
Albumin: 4.9 g/dL (ref 3.5–5.2)
Alkaline Phosphatase: 39 U/L (ref 39–117)
BUN: 14 mg/dL (ref 6–23)
CO2: 30 mEq/L (ref 19–32)
Calcium: 9.9 mg/dL (ref 8.4–10.5)
Chloride: 100 mEq/L (ref 96–112)
Creatinine, Ser: 0.74 mg/dL (ref 0.40–1.20)
GFR: 87.37 mL/min (ref >60.00)
Glucose, Bld: 91 mg/dL (ref 70–99)
Potassium: 3.8 mEq/L (ref 3.5–5.1)
Sodium: 138 mEq/L (ref 135–145)
Total Bilirubin: 0.9 mg/dL (ref 0.2–1.2)
Total Protein: 7.4 g/dL (ref 6.0–8.3)

## 2021-07-22 LAB — TSH: TSH: 1.48 u[IU]/mL (ref 0.35–5.50)

## 2021-07-22 LAB — LIPID PANEL
Cholesterol: 197 mg/dL (ref 0–200)
HDL: 83.4 mg/dL (ref >39.00)
LDL Cholesterol: 106 mg/dL — ABNORMAL HIGH (ref 0–99)
NonHDL: 113.61
Total CHOL/HDL Ratio: 2
Triglycerides: 40 mg/dL (ref 0.0–149.0)
VLDL: 8 mg/dL (ref 0.0–40.0)

## 2021-07-22 LAB — HEMOGLOBIN A1C: Hgb A1c MFr Bld: 5.2 % (ref 4.6–6.5)

## 2021-07-22 MED ORDER — FLUOCINONIDE 0.05 % EX SOLN
1.0000 | Freq: Two times a day (BID) | CUTANEOUS | 0 refills | Status: DC
Start: 2021-07-22 — End: 2022-07-26

## 2021-07-22 MED ORDER — FLUTICASONE PROPIONATE 50 MCG/ACT NA SUSP: 2.0000 | Freq: Every day | NASAL | 6 refills | Status: DC

## 2021-07-22 MED ORDER — LEVOTHYROXINE SODIUM 75 MCG PO TABS: ORAL_TABLET | ORAL | 0 refills | Status: DC

## 2021-07-22 MED ORDER — MONTELUKAST SODIUM 10 MG PO TABS: 10.0000 mg | ORAL_TABLET | Freq: Every day | ORAL | 3 refills | Status: DC

## 2021-07-22 NOTE — Patient Instructions (Signed)
It was very nice to see you today!  We will check blood work today.  I will refill your medications.  We will give your shingles vaccine.  I think you are probably having PVCs.  We will check a Holter monitor to make sure nothing else is going on.  I will see back in 1 year for your next physical.  Come back sooner if needed.  Take care, Dr [name]  PLEASE NOTE:  If you had any lab tests please let us know if you have not heard back within a few days. You may see your results on mychart before we have a chance to review them but we will give you a call once they are reviewed by [place]. If we ordered any referrals today, please let us know if you have not heard from their office within the next week.   Please try these tips to maintain a healthy lifestyle:  Eat at least 3 REAL meals and 1-2 snacks per day.  Aim for no more than 5 hours between eating.  If you eat breakfast, please do so within one hour of getting up.   Each meal should contain half fruits/vegetables, one quarter protein, and one quarter carbs (no bigger than a computer mouse)  Cut down on sweet beverages. This includes juice, soda, and sweet tea.   Drink at least 1 glass of water with each meal and aim for at least 8 glasses per day  Exercise at least 150 minutes every week.    Preventive Care 11-75 Years Old, Female Preventive care refers to lifestyle choices and visits with your health care provider that can promote health and wellness. Preventive care visits are also called wellness exams. What can I expect for my preventive care visit? Counseling Your health care provider may ask you questions about your: Medical history, including: Past medical problems. Family medical history. Pregnancy history. Current health, including: Menstrual cycle. Method of birth control. Emotional well-being. Home life and relationship well-being. Sexual activity and sexual health. Lifestyle, including: Alcohol, nicotine or  tobacco, and drug use. Access to firearms. Diet, exercise, and sleep habits. Work and work Statistician. Sunscreen use. Safety issues such as seatbelt and bike helmet use. Physical exam Your health care provider will check your: Height and weight. These may be used to calculate your BMI (body mass index). BMI is a measurement that tells if you are at a healthy weight. Waist circumference. This measures the distance around your waistline. This measurement also tells if you are at a healthy weight and may help predict your risk of certain diseases, such as type 2 diabetes and high blood pressure. Heart rate and blood pressure. Body temperature. Skin for abnormal spots. What immunizations do I need? Vaccines are usually given at various ages, according to a schedule. Your health care provider will recommend vaccines for you based on your age, medical history, and lifestyle or other factors, such as travel or where you work. What tests do I need? Screening Your health care provider may recommend screening tests for certain conditions. This may include: Lipid and cholesterol levels. Diabetes screening. This is done by checking your blood sugar (glucose) after you have not eaten for a while (fasting). Pelvic exam and Pap test. Hepatitis B test. Hepatitis C test. HIV (human immunodeficiency virus) test. STI (sexually transmitted infection) testing, if you are at risk. Lung cancer screening. Colorectal cancer screening. Mammogram. Talk with your health care provider about when you should start having regular mammograms. This may  depend on whether you have a family history of breast cancer. BRCA-related cancer screening. This may be done if you have a family history of breast, ovarian, tubal, or peritoneal cancers. Bone density scan. This is done to screen for osteoporosis. Talk with your health care provider about your test results, treatment options, and if necessary, the need for more  tests. Follow these instructions at home: Eating and drinking  Eat a diet that includes fresh fruits and vegetables, whole grains, lean protein, and low-fat dairy products. Take vitamin and mineral supplements as recommended by your health care provider. Do not drink alcohol if: Your health care provider tells you not to drink. You are pregnant, may be pregnant, or are planning to become pregnant. If you drink alcohol: Limit how much you have to 0-1 drink a day. Know how much alcohol is in your drink. In the [place] one drink equals one 12 oz bottle of beer (355 mL), one 5 oz glass of wine (148 mL), or one 1 oz glass of hard liquor (44 mL). Lifestyle Brush your teeth every morning and night with fluoride toothpaste. Floss one time each day. Exercise for at least 30 minutes 5 or more days each week. Do not use any products that contain nicotine or tobacco. These products include cigarettes, chewing tobacco, and vaping devices, such as e-cigarettes. If you need help quitting, ask your health care provider. Do not use drugs. If you are sexually active, practice safe sex. Use a condom or other form of protection to prevent STIs. If you do not wish to become pregnant, use a form of birth control. If you plan to become pregnant, see your health care provider for a prepregnancy visit. Take aspirin only as told by your health care provider. Make sure that you understand how much to take and what form to take. Work with your health care provider to find out whether it is safe and beneficial for you to take aspirin daily. Find healthy ways to manage stress, such as: Meditation, yoga, or listening to music. Journaling. Talking to a trusted person. Spending time with friends and family. Minimize exposure to UV radiation to reduce your risk of skin cancer. Safety Always wear your seat belt while driving or riding in a vehicle. Do not drive: If you have been drinking alcohol. Do not ride with someone  who has been drinking. When you are tired or distracted. While texting. If you have been using any mind-altering substances or drugs. Wear a helmet and other protective equipment during sports activities. If you have firearms in your house, make sure you follow all gun safety procedures. Seek help if you have been physically or sexually abused. What's next? Visit your health care provider once a year for an annual wellness visit. Ask your health care provider how often you should have your eyes and teeth checked. Stay up to date on all vaccines. This information is not intended to replace advice given to you by your health care provider. Make sure you discuss any questions you have with your health care provider. Document Revised: 11/04/2020 Document Reviewed: 11/04/2020 Elsevier Patient Education  [place].  Premature Ventricular Contraction A premature ventricular contraction (PVC) is a common kind of irregular heartbeat (arrhythmia). These contractions are extra heartbeats that start in the ventricles of the heart and occur too early in the normal sequence. During the PVC, the heart's normal electrical pathway is not used, so the beat is shorter and less effective. In most cases, these  contractions come and go and do not require treatment. What are the causes? Common causes of the condition include: Smoking. Drinking alcohol. Certain medicines. Some illegal drugs. Stress. Caffeine. Certain medical conditions can also cause PVCs: Heart failure. Heart attack, or coronary artery disease. Heart valve problems. Changes in minerals in the blood (electrolytes). Low blood oxygen levels or high carbon dioxide levels. In many cases, the cause of this condition is not known. What are the signs or symptoms? The main symptom of this condition is fast or skipped heartbeats (palpitations). Other symptoms include: Chest pain. Shortness of breath. Feeling  tired. Dizziness. Difficulty exercising. In some cases, there are no symptoms. How is this diagnosed? This condition may be diagnosed based on: Your medical history. A physical exam. During the exam, the health care provider will check for irregular heartbeats. Tests, such as: An ECG (electrocardiogram) to monitor the electrical activity of your heart. An ambulatory cardiac monitor. This device records your heartbeats for 24 hours or more. Stress tests to see how exercise affects your heart rhythm and blood supply. An echocardiogram. This test uses sound waves (ultrasound) to produce an image of your heart. An electrophysiology study (EPS). This test checks for electrical problems in your heart. How is this treated? Treatment for this condition depends on any underlying conditions, the type of PVCs that you are having, and how much the symptoms are interfering with your daily life. Possible treatments include: Avoiding things that cause premature contractions (triggers). These include caffeine and alcohol. Taking medicines if symptoms are severe or if the extra heartbeats are frequent. Getting treatment for underlying conditions that cause PVCs. Having an implantable cardioverter defibrillator (ICD), if you are at risk for a serious arrhythmia. The ICD is a small device that is inserted into your chest to monitor your heartbeat. When it senses an irregular heartbeat, it sends a shock to bring the heartbeat back to normal. Having a procedure to destroy the portion of the heart tissue that sends out abnormal signals (catheter ablation). In some cases, no treatment is required. Follow these instructions at home: Lifestyle Do not use any products that contain nicotine or tobacco, such as cigarettes, e-cigarettes, and chewing tobacco. If you need help quitting, ask your health care provider. Do not use illegal drugs. Exercise regularly. Ask your health care provider what type of exercise is  safe for you. Try to get at least 7-9 hours of sleep each night, or as much as recommended by your health care provider. Find healthy ways to manage stress. Avoid stressful situations when possible. Alcohol use Do not drink alcohol if: Your health care provider tells you not to drink. You are pregnant, may be pregnant, or are planning to become pregnant. Alcohol triggers your episodes. If you drink alcohol: Limit how much you use to: 0-1 drink a day for women. 0-2 drinks a day for men. Be aware of how much alcohol is in your drink. In the [place] one drink equals one 12 oz bottle of beer (355 mL), one 5 oz glass of wine (148 mL), or one 1 oz glass of hard liquor (44 mL). General instructions Take over-the-counter and prescription medicines only as told by your health care provider. If caffeine triggers episodes of PVC, do not eat, drink, or use anything with caffeine in it. Keep all follow-up visits as told by your health care provider. This is important. Contact a health care provider if you: Feel palpitations. Get help right away if you: Have chest pain. Have shortness  of breath. Have sweating for no reason. Have nausea and vomiting. Become light-headed or you faint. Summary A premature ventricular contraction (PVC) is a common kind of irregular heartbeat (arrhythmia). In most cases, these contractions come and go and do not require treatment. You may need to wear an ambulatory cardiac monitor. This records your heartbeats for 24 hours or more. Treatment depends on any underlying conditions, the type of PVCs that you are having, and how much the symptoms are interfering with your daily life. This information is not intended to replace advice given to you by your health care provider. Make sure you discuss any questions you have with your health care provider. Document Revised: 10/06/2020 Document Reviewed: 10/06/2020 Elsevier Patient Education  [place].

## 2021-07-22 NOTE — Assessment & Plan Note (Signed)
Check TSH.  Continue Synthroid 75 mcg daily. 

## 2021-07-22 NOTE — Progress Notes (Unsigned)
Enrolled for [employer] to mail a ZIO XT long term holter monitor to the patients address on file.  

## 2021-07-22 NOTE — Assessment & Plan Note (Signed)
No red flags.  Possibly PVCs or PACs.  We will check labs today.  We will check Holter monitor to confirm.  Discussed importance of good hydration and avoidance of triggers such as caffeine and stress.  ?

## 2021-07-22 NOTE — Progress Notes (Signed)
? ?Chief Complaint:  ?Jordan Anderson is a 61 y.o. female who presents today for her annual comprehensive physical exam.   ? ?Assessment/Plan:  ?Chronic Problems Addressed Today: ?Allergic rhinitis ?Worsened recently.  Will restart Flonase and Singulair.  She has done well with these in the past. ? ?Hypothyroidism ?Check TSH.  Continue Synthroid 75 mcg daily. ? ?Palpitations ?No red flags.  Possibly PVCs or PACs.  We will check labs today.  We will check Holter monitor to confirm.  Discussed importance of good hydration and avoidance of triggers such as caffeine and stress.  ? ?Eczema ?We will refill fluocinonide. ? ?Preventative Healthcare: ?Check labs. Shingles vaccine given today. UTD on mammogram, pap smear, and colonoscopy. UTD on other screenings and vaccines. ? ?Patient Counseling(The following topics were reviewed and/or handout was given): ? -Nutrition: Stressed importance of moderation in sodium/caffeine intake, saturated fat and cholesterol, caloric balance, sufficient intake of fresh fruits, vegetables, and fiber. ? -Stressed the importance of regular exercise.  ? -Substance Abuse: Discussed cessation/primary prevention of tobacco, alcohol, or other drug use; driving or other dangerous activities under the influence; availability of treatment for abuse.  ? -Injury prevention: Discussed safety belts, safety helmets, smoke detector, smoking near bedding or upholstery.  ? -Sexuality: Discussed sexually transmitted diseases, partner selection, use of condoms, avoidance of unintended pregnancy and contraceptive alternatives.  ? -Dental health: Discussed importance of regular tooth brushing, flossing, and dental visits. ? -Health maintenance and immunizations reviewed. Please refer to Health maintenance section. ? ?Return to care in 1 year for next preventative visit.  ? ?  ?Subjective:  ?HPI: ? ?She has no acute complaints today.  ? ?She complain of heart palpitations for the past couple of months.  She described this as "skipping beat". This usually happened at night. Symptoms are resolved in the morning. She never had anything like this before. Usually last 15-20 minutes. This has been disturbing her sleep. She recently has increased anxiety. No reported chest pain.  ? ?Depression screen [facility] 2/9 07/22/2021  ?Decreased Interest 0  ?Down, Depressed, Hopeless 0  ?PHQ - 2 Score 0  ? ? ?There are no preventive care reminders to display for this patient. ?  ? ?ROS: Per HPI, otherwise a complete review of systems was negative.  ? ?PMH: ? ?The following were reviewed and entered/updated in epic: ?Past Medical History:  ?Diagnosis Date  ? Allergy   ? Cataract   ? Hypothyroidism   ? Seasonal allergies   ? ?Patient Active Problem List  ? Diagnosis Date Noted  ? Palpitations 07/22/2021  ? Right shoulder pain 04/28/2020  ? Osteopenia after menopause 02/13/2020  ? Eczema 06/25/2018  ? 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 History of colonic polyps 05/08/2009  ? VARICOSE VEINS, LOWER EXTREMITIES 03/21/2008  ? Hypothyroidism 01/23/2007  ? Allergic rhinitis 01/23/2007  ? ?Past Surgical History:  ?Procedure Laterality Date  ? ablation uterus  2005  ? VEIN LIGATION Left 2012  ? ? ?Family History  ?Problem Relation Age of Onset  ? Breast cancer Mother   ? Cancer Father   ? Prostate cancer Father   ? Colon cancer Neg Hx   ? Esophageal cancer Neg Hx   ? Rectal cancer Neg Hx   ? Stomach cancer Neg Hx   ? ? ?Medications- reviewed and updated ?Current Outpatient Medications  ?Medication Sig Dispense Refill  ? fluocinonide (LIDEX) 0.05 % external solution Apply 1 application topically 2 (two) times daily. 60 mL 0  ? fluticasone (FLONASE) 50  MCG/ACT nasal spray Place 2 sprays into both nostrils daily. 16 g 6  ? montelukast (SINGULAIR) 10 MG tablet Take 1 tablet (10 mg total) by mouth at bedtime. 30 tablet 3  ? levothyroxine (SYNTHROID) 75 MCG tablet TAKE 1 TABLET BY MOUTH EVERY DAY 90 tablet 0  ? ?No current  facility-administered medications for this visit.  ? ? ?Allergies-reviewed and updated ?No Known Allergies ? ?Social History  ? ?Socioeconomic History  ? Marital status: Married  ?  Spouse name: Not on file  ? Number of children: Not on file  ? Years of education: Not on file  ? Highest education level: Not on file  ?Occupational History  ? Not on file  ?Tobacco Use  ? Smoking status: Never  ? Smokeless tobacco: Never  ?Vaping Use  ? Vaping Use: Never used  ?Substance and Sexual Activity  ? Alcohol use: Yes  ?  Alcohol/week: 10.0 standard drinks  ?  Types: 10 Glasses of wine per week  ? Drug use: No  ? Sexual activity: Yes  ?  Birth control/protection: Surgical  ?Other Topics Concern  ? Not on file  ?Social History Narrative  ? Not on file  ? ?Social Determinants of Health  ? ?Financial Resource Strain: Not on file  ?Food Insecurity: Not on file  ?Transportation Needs: Not on file  ?Physical Activity: Not on file  ?Stress: Not on file  ?Social Connections: Not on file  ? ?   ?  ?Objective:  ?Physical Exam: ?BP 139/86   Pulse 67   Temp 97.7 ?F (36.5 ?C) (Temporal)   Ht 5' 5.75" (1.67 m)   Wt 111 lb 6.4 oz (50.5 kg)   SpO2 98%   BMI 18.12 kg/m?   ?Body mass index is 18.12 kg/m?. ?Wt Readings from Last 3 Encounters:  ?07/22/21 111 lb 6.4 oz (50.5 kg)  ?07/02/20 108 lb (49 kg)  ?06/29/20 112 lb (50.8 kg)  ? ?Gen: NAD, resting comfortably ?HEENT: TMs normal bilaterally. OP clear. No thyromegaly noted.  ?CV: RRR with no murmurs appreciated ?Pulm: NWOB, CTAB with no crackles, wheezes, or rhonchi ?GI: Normal bowel sounds present. Soft, Nontender, Nondistended. ?MSK: no edema, cyanosis, or clubbing noted ?Skin: warm, dry ?Neuro: CN2-12 grossly intact. Strength 5/5 in upper and lower extremities. Reflexes symmetric and intact bilaterally.  ?Psych: Normal affect and thought content ?   ? ? ?I,Savera Zaman,acting as a Education administrator for Dimas Chyle, MD.,have documented all relevant documentation on the behalf of Dimas Chyle,  MD,as directed by  Dimas Chyle, MD while in the presence of Dimas Chyle, MD.  ? ?I, Dimas Chyle, MD, have reviewed all documentation for this visit. The documentation on 07/22/21 for the exam, diagnosis, procedures, and orders are all accurate and complete. ? ?Algis Greenhouse. [name], MD ?07/22/2021 2:55 PM  ? ?

## 2021-07-22 NOTE — Assessment & Plan Note (Signed)
Worsened recently.  Will restart Flonase and Singulair.  She has done well with these in the past. ?

## 2021-07-22 NOTE — Assessment & Plan Note (Signed)
We will refill fluocinonide. ?

## 2021-07-23 ENCOUNTER — Encounter: Payer: Self-pay | Admitting: Family Medicine

## 2021-07-23 DIAGNOSIS — E785 Hyperlipidemia, unspecified: Secondary | ICD-10-CM | POA: Insufficient documentation

## 2021-07-23 NOTE — Progress Notes (Signed)
Please inform patient of the following: ? ?Cholesterol slightly elevated but stable.  Everything else is normal. Do not need to make any changes.  Plan at this time.  She should continue to work on diet and exercise and we can recheck in a year.

## 2021-08-01 DIAGNOSIS — R002 Palpitations: Secondary | ICD-10-CM

## 2021-08-11 DIAGNOSIS — R002 Palpitations: Secondary | ICD-10-CM | POA: Diagnosis not present

## 2021-08-12 NOTE — Progress Notes (Signed)
Please inform patient of the following: ? ?Her heart monitor showed that she had several rapid heart rhythms called SVT. We do not need to do anything urgently but it would be a good idea for her to see a cardiologist. Please place referral if she is agreeable. ? ?Jordan Anderson. [name], MD ?08/12/2021 8:07 AM  ?

## 2021-08-17 ENCOUNTER — Other Ambulatory Visit: Payer: Self-pay

## 2021-08-17 DIAGNOSIS — R000 Tachycardia, unspecified: Secondary | ICD-10-CM

## 2021-08-18 ENCOUNTER — Encounter: Payer: Self-pay | Admitting: Cardiology

## 2021-08-18 NOTE — Progress Notes (Signed)
?  ?Cardiology Office Note ? ? ?Date:  08/19/2021  ? ?ID:  Jordan Anderson, DOB 01/25/1961, MRN [id number] ? ?PCP:  Vivi Barrack, MD  ?Cardiologist:   None ?Referring:  Vivi Barrack, MD ? ?Chief Complaint  ?Patient presents with  ? Palpitations  ? ? ?  ?History of Present Illness: ?Jordan Anderson is a 61 y.o. female who presents for evaluation of palpitations.  She is referred by Vivi Barrack, MD she has been having palpitations for 4 to 5 months.  This was happening less frequently but seems to be a little bit more.  Particularly has been waking her from her sleep.  She did wear a 3-day monitor that demonstrated several episodes of SVT .  He had 13 beats with the longest run being 8.5 seconds.  She had lots of isolated premature atrial contractions in particular.  There were some PVCs.  There were no prolonged pauses or sustained arrhythmias.  She feels both her heart racing at times.  She will also feel it skip.  Again this happens more after she goes to sleep and it can wake her from her sleep.  Seems to be happening most nights recently but it does come and go.  She is felt a little lightheaded during tennis matches previously but she has not really noticing this otherwise.  It happens if she is in between sets.  She has not had any chest pressure, neck or arm discomfort.  She had no new shortness of breath, PND or orthopnea.  She is otherwise felt well. ? ? ?Past Medical History:  ?Diagnosis Date  ? Allergy   ? Cataract   ? Hypothyroidism   ? Seasonal allergies   ? SVT (supraventricular tachycardia) ([place])   ? ? ?Past Surgical History:  ?Procedure Laterality Date  ? ablation uterus  2005  ? VEIN LIGATION Left 2012  ? ? ? ?Current Outpatient Medications  ?Medication Sig Dispense Refill  ? fluocinonide (LIDEX) 0.05 % external solution Apply 1 application topically 2 (two) times daily. 60 mL 0  ? fluticasone (FLONASE) 50 MCG/ACT nasal spray Place 2 sprays into both nostrils daily. 16 g 6  ?  levothyroxine (SYNTHROID) 75 MCG tablet TAKE 1 TABLET BY MOUTH EVERY DAY 90 tablet 0  ? montelukast (SINGULAIR) 10 MG tablet Take 1 tablet (10 mg total) by mouth at bedtime. 30 tablet 3  ? propranolol (INDERAL) 10 MG tablet Take 1 tablet (10 mg total) by mouth every 8 (eight) hours as needed. 30 tablet 6  ? ?No current facility-administered medications for this visit.  ? ? ?Allergies:   Patient has no known allergies.  ? ? ?Social History:  The patient  reports that she has never smoked. She has never used smokeless tobacco. She reports current alcohol use of about 10.0 standard drinks per week. She reports that she does not use drugs.  ? ?Family History:  The patient's family history includes Breast cancer in her mother; Cancer in her father; Prostate cancer in her father.  ? ? ?ROS:  Please see the history of present illness.   Otherwise, review of systems are positive for none.   All other systems are reviewed and negative.  ? ? ?PHYSICAL EXAM: ?VS:  BP 104/82   Pulse 70   Ht 5' 5.5" (1.664 m)   Wt 113 lb 9.6 oz (51.5 kg)   SpO2 90%   BMI 18.62 kg/m?  , BMI Body mass index is 18.62 kg/m?. ?GENERAL:  Well appearing ?HEENT:  Pupils equal round and reactive, fundi not visualized, oral mucosa unremarkable ?NECK:  No jugular venous distention, waveform within normal limits, carotid upstroke brisk and symmetric, no bruits, no thyromegaly ?LYMPHATICS:  No cervical, inguinal adenopathy ?LUNGS:  Clear to auscultation bilaterally ?BACK:  No CVA tenderness ?CHEST:  Unremarkable ?HEART:  PMI not displaced or sustained,S1 and S2 within normal limits, no S3, no S4, no clicks, no rubs, no murmurs ?ABD:  Flat, positive bowel sounds normal in frequency in pitch, no bruits, no rebound, no guarding, no midline pulsatile mass, no hepatomegaly, no splenomegaly ?EXT:  2 plus pulses throughout, no edema, no cyanosis no clubbing ?SKIN:  No rashes no nodules ?NEURO:  Cranial nerves II through XII grossly intact, motor grossly intact  throughout ?PSYCH:  Cognitively intact, oriented to person place and time ? ? ? ?EKG:  EKG is ordered today. ?The ekg ordered today demonstrates probable ectopic atrial rhythm with an unusual P wave, premature atrial contractions, rate 78, axis within normal limits, low voltage in the limb and chest leads, RSR prime V1 and V2, no acute ST-T wave changes. ?Recent Labs: ?07/22/2021: ALT 13; BUN 14; Creatinine, Ser 0.74; Hemoglobin 14.0; Platelets 230.0; Potassium 3.8; Sodium 138; TSH 1.48  ? ? ?Lipid Panel ?   ?Component Value Date/Time  ? CHOL 197 07/22/2021 1452  ? TRIG 40.0 07/22/2021 1452  ? HDL 83.40 07/22/2021 1452  ? CHOLHDL 2 07/22/2021 1452  ? VLDL 8.0 07/22/2021 1452  ? LDLCALC 106 (H) 07/22/2021 1452  ? LDLDIRECT 102.2 02/06/2012 1013  ? ?  ? ?Wt Readings from Last 3 Encounters:  ?08/19/21 113 lb 9.6 oz (51.5 kg)  ?07/22/21 111 lb 6.4 oz (50.5 kg)  ?07/02/20 108 lb (49 kg)  ?  ? ? ?Other studies Reviewed: ?Additional studies/ records that were reviewed today include:  ?Marland Kitchen ?Review of the above records demonstrates:  Please see elsewhere in the note.   ? ? ?ASSESSMENT AND PLAN: ? ?SVT: The patient does have a fair amount of supraventricular ectopy with some brief runs of SVT but also premature atrial contractions.  Had normal thyroid and electrolytes.  At this point I am just going to try a low-dose of propranolol as needed to see if this helps.  I did review the monitor in detail.  She has a resting heart rate of sometimes sinus bradycardia.  I think she would tolerate this as there did not seem to be prolonged bradycardia arrhythmias.  She will let me know if this does not help and I might make other suggestions.  From her exam and otherwise I would suspect a structurally normal heart so no further imaging. ? ? ?Current medicines are reviewed at length with the patient today.  The patient does not have concerns regarding medicines. ? ?The following changes have been made: As above ? ?Labs/ tests ordered today  include: None ? ?Orders Placed This Encounter  ?Procedures  ? EKG 12-Lead  ? ? ? ?Disposition:   FU with me as needed ? ? ?Signed, ?Minus Breeding, MD  ?08/19/2021 8:56 AM    ?[place] ? ? ? ?

## 2021-08-19 ENCOUNTER — Ambulatory Visit (INDEPENDENT_AMBULATORY_CARE_PROVIDER_SITE_OTHER): Payer: BC Managed Care – PPO | Admitting: Cardiology

## 2021-08-19 ENCOUNTER — Encounter: Payer: Self-pay | Admitting: Cardiology

## 2021-08-19 VITALS — BP 104/82 | HR 70 | Ht 65.5 in | Wt 113.6 lb

## 2021-08-19 DIAGNOSIS — I471 Supraventricular tachycardia: Secondary | ICD-10-CM | POA: Diagnosis not present

## 2021-08-19 MED ORDER — PROPRANOLOL HCL 10 MG PO TABS: 10.0000 mg | ORAL_TABLET | Freq: Three times a day (TID) | ORAL | 6 refills | Status: AC | PRN

## 2021-08-19 NOTE — Patient Instructions (Signed)
Medication Instructions:  ?START Propranolol 10 mg every 8 hours as needed. ? ?*If you need a refill on your cardiac medications before your next appointment, please call your pharmacy* ? ? ?Lab Work: ?None ordered ?If you have labs (blood work) drawn today and your tests are completely normal, you will receive your results only by: ?MyChart Message (if you have MyChart) OR ?A paper copy in the mail ?If you have any lab test that is abnormal or we need to change your treatment, we will call you to review the results. ? ? ?Testing/Procedures: ?None ordered ? ? ?Follow-Up: ?At [facility], you and your health needs are our priority.  As part of our continuing mission to provide you with exceptional heart care, we have created designated Provider Care Teams.  These Care Teams include your primary Cardiologist (physician) and Advanced Practice Providers (APPs -  Physician Assistants and Nurse Practitioners) who all work together to provide you with the care you need, when you need it. ? ?We recommend signing up for the patient portal called "MyChart".  Sign up information is provided on this After Visit Summary.  MyChart is used to connect with patients for Virtual Visits (Telemedicine).  Patients are able to view lab/test results, encounter notes, upcoming appointments, etc.  Non-urgent messages can be sent to your provider as well.   ?To learn more about what you can do with MyChart, go to [email or web address].   ? ?Your next appointment:   ?Follow up as needed with Dr. Percival Spanish ? ?

## 2021-10-05 ENCOUNTER — Ambulatory Visit (INDEPENDENT_AMBULATORY_CARE_PROVIDER_SITE_OTHER): Payer: BC Managed Care – PPO | Admitting: Sports Medicine

## 2021-10-05 VITALS — BP 143/83 | Ht 65.5 in | Wt 112.0 lb

## 2021-10-05 DIAGNOSIS — M25562 Pain in left knee: Secondary | ICD-10-CM

## 2021-10-05 DIAGNOSIS — S86112A Strain of other muscle(s) and tendon(s) of posterior muscle group at lower leg level, left leg, initial encounter: Secondary | ICD-10-CM

## 2021-10-05 DIAGNOSIS — M25462 Effusion, left knee: Secondary | ICD-10-CM

## 2021-10-05 MED ORDER — MELOXICAM 15 MG PO TABS: 15.0000 mg | ORAL_TABLET | Freq: Every day | ORAL | 0 refills | Status: DC

## 2021-10-05 NOTE — Patient Instructions (Signed)
It was great to see you today, thank you for letting me participate in your care! ? ?Today, we discussed your knee and calf pain. ? ?You have swelling within your knee.  You also have a partial tear of one of your calf muscle (gastrocnemius). ? ?Things to do for this: ?-Wear the knee compression sleeve when playing tennis, walking and being active ?-Ice the knee for 20 minutes twice a day especially after activity ?-Take meloxicam 15 mg, 1 tablet once a day for the next 2 weeks, then as needed ?-Would recommend using the heel lifts in your shoes while your calf tear is healing ?-He will perform the calf Exercises that Jinny Blossom will show you today ? ?You will follow-up with Dr. Oneida Alar in about 4 weeks for reevaluation ? ?If you have any further questions, please give the clinic a call [phone]. ? ?Cheers, ? ?Elba Barman, DO ?[place] ? ?

## 2021-10-05 NOTE — Progress Notes (Signed)
PCP: Vivi Barrack, MD  Subjective:   HPI: Jordan Anderson is a pleasant 61 y.o. female here for left knee pain and calf pain.  She states that about 6 weeks ago he had insidious onset of left knee and posterior proximal calf pain and swelling.  She states that she does lay tennis, walks and is quite active but denies any specific injury that caused her pain.  She has noticed some swelling about the knee.  She does feel like her proximal calf is larger as well as her pants fit tighter on this side.  She does get pain and stiffness after prolonged period of sitting or standing.  She denies any locking/clicking or instability.  She does use Voltaren topically and ibuprofen which does help her pain to some extent.  She denies any redness, erythema, no fever or chills.  Denies any previous issues or injuries with the knee.  She still is able to continue playing tennis and being active, although it is more painful or sore following activity.  No Known Allergies  BP (!) 143/83   Ht 5' 5.5" (1.664 m)   Wt 112 lb (50.8 kg)   BMI 18.35 kg/m      04/28/2020   10:00 AM  [place] [facility]  Frequency of aerobic exercise (# of days/week) 5  Average time in minutes 75  Frequency of strengthening activities (# of days/week) 0        View : No data to display.              Objective:  Physical Exam:  Gen: Well-appearing, in no acute distress; non-toxic CV: Regular Rate. Well-perfused. Warm.  Resp: Breathing unlabored on room air; no wheezing. Psych: Fluid speech in conversation; appropriate affect; normal thought process Neuro: Sensation intact throughout. No gross coordination deficits.  MSK:   - Left knee/leg: Inspection of the knee demonstrates a moderate effusion about the knee.  There is no erythema or significant warmth.  There is some mild tenderness to palpation both on the medial and lateral joint line.  She does have tenderness to palpation  within the proximal aspect  of the medial gastrocnemius tendon and popliteal fossa.  The calf is slightly larger compared to the contralateral calf.  Range of motion is 0-135 degrees.  There is no varus or valgus instability.  Negative patellar grind, negative anterior/posterior drawer, negative Thessaly's.  No varus or valgus instability.  Negative McMurray's testing.  Strength 5/5 in all directions.  Neurovascular intact distally.  MSK Limited knee ultrasound performed, left  - Evaluation of the suprapatellar pouch demonstrate and long and short axis shows an at least moderate to large joint effusion -Patella intact without cortical regularity and patellar tendon about in long axis without abnormality. -Medial meniscus and MCL visualized without evidence of tearing or extrusion -Lateral meniscus Is incompletely visualized, although does show some calcification within the meniscus with evidence of likely degenerative tearing. -Popliteal fossa visualized with evidence of some joint effusion present. -Evaluation of the proximal aspect of the medial head of the gastrocnemius shows high-grade partial tearing near the insertion point with hypoechoic fluid surrounding.  There are also some calcifications within this tear of the tendon which may reflect myositis ossifications. Scanning distally toward the mid belly of the gastro anemia shows an intact tendon without abnormality.  IMPRESSION: Moderate-large joint effusion.  Possible lateral degenerative meniscal tear.  Most notably, there is a high-grade partial tear of the medial head of the gastrocnemius tendon with  surrounding hypoechoic fluid and calcification near the insertional point    Assessment & Plan:  1. Left knee pain with effusion 2. High-grade partial tear of proximal medial head of gastrocnemius, left  3. 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  -For the patient for a body helix knee sleeve, she is to wear this with activity and when walking -We will do a  short course of anti-inflammatory, Mobic 15 mg to be taken once daily for the next 2 weeks -Recommended ice for 15-20 minutes multiple times a day and attempt to control swelling -We did fit the patient for heel lifts to take pressure off the gastroc muscles, she is to wear these in her shoes -Home exercises instructed and demonstrated for the patient today for her gastrocnemius tear -We will follow up in 4 weeks for reevaluation -If she is still having pain more so within the knee joint, we would likely move forward with x-rays to assess for any arthritic change.  Can always consider aspiration of the knee joint as well.  Elba Barman, DO PGY-4, Sports Medicine Fellow [place]  This note was dictated using Dragon naturally speaking software and may contain errors in syntax, spelling, or content which have not been identified prior to signing this note.

## 2021-10-20 ENCOUNTER — Other Ambulatory Visit: Payer: Self-pay | Admitting: Family Medicine

## 2021-10-20 DIAGNOSIS — E039 Hypothyroidism, unspecified: Secondary | ICD-10-CM

## 2021-11-01 ENCOUNTER — Other Ambulatory Visit: Payer: Self-pay | Admitting: Sports Medicine

## 2021-11-02 ENCOUNTER — Ambulatory Visit (INDEPENDENT_AMBULATORY_CARE_PROVIDER_SITE_OTHER): Payer: BC Managed Care – PPO | Admitting: Sports Medicine

## 2021-11-02 VITALS — BP 110/70 | Ht 65.5 in | Wt 113.0 lb

## 2021-11-02 DIAGNOSIS — S86112D Strain of other muscle(s) and tendon(s) of posterior muscle group at lower leg level, left leg, subsequent encounter: Secondary | ICD-10-CM

## 2021-11-02 DIAGNOSIS — M25562 Pain in left knee: Secondary | ICD-10-CM | POA: Diagnosis not present

## 2021-11-02 DIAGNOSIS — M25462 Effusion, left knee: Secondary | ICD-10-CM | POA: Diagnosis not present

## 2021-11-02 NOTE — Progress Notes (Signed)
PCP: Vivi Barrack, MD  Subjective:   HPI: Jordan Anderson is a very pleasant 61 y.o. female here for follow-up of left knee pain and left gastrocnemius tear.  She has been very pleased with her improvement process.  She did take the meloxicam once a day scheduled for the first 2 weeks, but since has been taking it more on a as needed basis.  She does wear the body helix compression sleeve when she is walking with tennis and biking activity.  She reports her swelling is much improved.  She has been doing the home exercises for the knee and calf at least 3-4 times a week.  She feels like she is 95% improved since her last visit.  She has gotten back into tennis, walking and biking without any setbacks.  She denies any new injury.  BP 110/70   Ht 5' 5.5" (1.664 m)   Wt 113 lb (51.3 kg)   BMI 18.52 kg/m      04/28/2020   10:00 AM  [place] [facility]  Frequency of aerobic exercise (# of days/week) 5  Average time in minutes 75  Frequency of strengthening activities (# of days/week) 0        No data to display              Objective:  Physical Exam:  Gen: Well-appearing, in no acute distress; non-toxic CV: Regular Rate. Well-perfused. Warm.  Resp: Breathing unlabored on room air; no wheezing. Psych: Fluid speech in conversation; appropriate affect; normal thought process Neuro: Sensation intact throughout. No gross coordination deficits.  MSK:  - Left knee/calf: Inspection of the knee demonstrates a very trace effusion about the knee.  There is no erythema or significant warmth.  No medial or lateral joint line tenderness.  There is very mild TTP at the proximal gastro knee medius insertion.  The left calf is slightly larger compared to the right contralateral calf.  Range of motion of the knee is preserved from 0-135 degrees.  No varus or valgus instability.  Negative McMurray's testing.  Bilateral heel raise equivalent.  Strength 5/5 in all directions.  Neurovascularly  intact distally.  MSK Limited knee ultrasound performed, left: -Evaluation of the suprapatellar pouch in both short and long axis demonstrates a small joint effusion -Patellar tendon intact without cortical irregularity in both short and long axis -Medial meniscus visualized with a small avulsion tear off the tibial side with surrounding hypoechoic fluid -Lateral meniscus is incompletely visualized with possible small areas of calcification without evidence of gross tearing.  No significant hyperemia -Popliteal fossa visualized without evidence of joint effusion or Baker's cyst -Evaluation of the proximal aspect of the medial head of the gastrocnemius tendon does demonstrate partial tearing near the insertion with some mild hypoechoic fluid surrounding, although much improved from previous scan.  There is also very small areas of calcification within the tendon, but again this is to a lesser extent than her last scan 1 month ago.  Scanning distally toward the mid belly of the gastrocnemius tendon shows an intact tendon without abnormality.  IMPRESSION: Trace joint effusion with likely medial meniscal tear, resolving.  Improved proximal medial head of the gastrocnemius tendon tear with reduction in swelling and calcification from prior scan.  Ultrasound and interpretation by Dr. Rolena Infante and Wolfgang Phoenix. Fields, MD    Assessment & Plan:  1. Left knee pain with trace effusion - improved 2. High-grade partial tear of proximal medial head of gastrocnemius, left - improving 3.  Suspected medial meniscus degenerative tear  We had a good discussion with Jordan Anderson to get a regarding her repeat ultrasound scan and her clinical exam which both show great improvement.  We did discuss however that she still has some healing to do both for the gastrocnemius tendon and a very trace effusion of the knee, so she is to avoid playing tennis on a hard surfaces.  She can continue activity otherwise as indicated.  We do  recommend continuing with the body helix compression sleeve when she is active and keeping the heel lifts in both shoes to help offload the gastrocnemius tendon.  She may continue meloxicam only as needed, as well as icing following activity and if any swelling is going about the knee.  She will continue her home exercises both for the knee and for the calf muscle.  We will see her back in 2 months and repeat ultrasound at that time to evaluate for further degree of healing.  She may call or return sooner if any issues arise.  I did re-send Meloxicam refill for the patient yesterday, 11/01/21.  Elba Barman, DO PGY-4, Sports Medicine Fellow [place]  This note was dictated using Dragon naturally speaking software and may contain errors in syntax, spelling, or content which have not been identified prior to signing this note.   I observed and examined the patient with the [facility] resident and agree with assessment and plan.  Note reviewed and modified by me. Ila Mcgill, MD

## 2021-12-01 ENCOUNTER — Other Ambulatory Visit: Payer: Self-pay | Admitting: Family Medicine

## 2021-12-28 ENCOUNTER — Encounter: Payer: BC Managed Care – PPO | Admitting: Sports Medicine

## 2022-01-11 ENCOUNTER — Ambulatory Visit: Payer: BC Managed Care – PPO | Admitting: Sports Medicine

## 2022-01-17 ENCOUNTER — Other Ambulatory Visit: Payer: Self-pay | Admitting: Family Medicine

## 2022-01-17 DIAGNOSIS — E039 Hypothyroidism, unspecified: Secondary | ICD-10-CM

## 2022-01-18 ENCOUNTER — Ambulatory Visit (INDEPENDENT_AMBULATORY_CARE_PROVIDER_SITE_OTHER): Payer: BC Managed Care – PPO | Admitting: Sports Medicine

## 2022-01-18 DIAGNOSIS — G8929 Other chronic pain: Secondary | ICD-10-CM

## 2022-01-18 DIAGNOSIS — M25562 Pain in left knee: Secondary | ICD-10-CM | POA: Diagnosis not present

## 2022-01-18 NOTE — Assessment & Plan Note (Signed)
This is much improved and she has been able to return to full activity We will continue to use a compression sleeve for tennis Continue on strengthening exercises  She can return as needed

## 2022-01-18 NOTE — Progress Notes (Signed)
Chief complaint left knee pain Patient was first seen on May 16 with left knee pain thought to be from a degenerative meniscus She is an active tennis player and start to get some tightness and swelling She was also found to have a partial tear of her gastroc at the insertion behind the knee  She has been using a compression sleeve and doing home exercises She has returned fully to her tennis and her walking She does not feel she is getting any swelling The left knee feels as flexible as the right at this time  Physical exam Thin and athletic white female BP (!) 104/58   Ht 5' 5.5" (1.664 m)   BMI 18.52 kg/m   Knee: Normal to inspection with no erythema or effusion or obvious bony abnormalities. Palpation normal with no warmth or joint line tenderness or patellar tenderness or condyle tenderness. ROM normal in flexion and extension and lower leg rotation. Ligaments with solid consistent endpoints including ACL, PCL, LCL, MCL. Negative Mcmurray's and provocative meniscal tests. Non painful patellar compression. Patellar and quadriceps tendons unremarkable. Hamstring and quadriceps strength is normal.  There is now no tenderness over the gastrocs

## 2022-02-14 ENCOUNTER — Encounter: Payer: Self-pay | Admitting: *Deleted

## 2022-02-16 ENCOUNTER — Other Ambulatory Visit: Payer: Self-pay | Admitting: Family Medicine

## 2022-02-16 DIAGNOSIS — Z1231 Encounter for screening mammogram for malignant neoplasm of breast: Secondary | ICD-10-CM

## 2022-02-28 ENCOUNTER — Ambulatory Visit
Admission: RE | Admit: 2022-02-28 | Discharge: 2022-02-28 | Disposition: A | Discharge status: Home / Self Care | Payer: BC Managed Care – PPO | Source: Ambulatory Visit | Attending: Family Medicine | Admitting: Family Medicine

## 2022-02-28 DIAGNOSIS — Z1231 Encounter for screening mammogram for malignant neoplasm of breast: Secondary | ICD-10-CM | POA: Diagnosis not present

## 2022-03-10 DIAGNOSIS — F4320 Adjustment disorder, unspecified: Secondary | ICD-10-CM | POA: Diagnosis not present

## 2022-03-31 DIAGNOSIS — F4320 Adjustment disorder, unspecified: Secondary | ICD-10-CM | POA: Diagnosis not present

## 2022-04-25 ENCOUNTER — Telehealth: Payer: Self-pay | Admitting: Family Medicine

## 2022-04-25 ENCOUNTER — Other Ambulatory Visit: Payer: Self-pay | Admitting: Family Medicine

## 2022-04-25 DIAGNOSIS — E039 Hypothyroidism, unspecified: Secondary | ICD-10-CM

## 2022-04-25 NOTE — Telephone Encounter (Signed)
  LAST APPOINTMENT DATE:  07/22/21  NEXT APPOINTMENT DATE: None  MEDICATION:  levothyroxine (SYNTHROID) 75 MCG tablet   Is the patient out of medication? Not provided by pt  PHARMACY: [id number] [place], [place] [address] [id number]: [phone] Fax: [phone]

## 2022-04-26 NOTE — Telephone Encounter (Signed)
Refill sent to pharmacy.   

## 2022-05-05 ENCOUNTER — Encounter: Payer: Self-pay | Admitting: *Deleted

## 2022-06-30 ENCOUNTER — Encounter: Payer: Self-pay | Admitting: Family

## 2022-06-30 ENCOUNTER — Ambulatory Visit (INDEPENDENT_AMBULATORY_CARE_PROVIDER_SITE_OTHER): Payer: BC Managed Care – PPO | Admitting: Family

## 2022-06-30 VITALS — BP 126/86 | HR 80 | Temp 97.5°F | Ht 65.5 in | Wt 108.1 lb

## 2022-06-30 DIAGNOSIS — J020 Streptococcal pharyngitis: Secondary | ICD-10-CM | POA: Diagnosis not present

## 2022-06-30 LAB — POCT RAPID STREP A (OFFICE): Rapid Strep A Screen: POSITIVE — AB

## 2022-06-30 MED ORDER — AMOXICILLIN 500 MG PO CAPS: 500.0000 mg | ORAL_CAPSULE | Freq: Two times a day (BID) | ORAL | 0 refills | Status: AC

## 2022-06-30 NOTE — Progress Notes (Signed)
Patient ID: Jordan Anderson, female    DOB: October 31, 1960, 62 y.o.   MRN: [id number]  Chief Complaint  Patient presents with   Sore Throat    Pt c/o sore throat since Monday along with a fever of 100.9 and headaches. Has tried gargled salt water and tried aspirin which did not help.     HPI:      Sore throat:  Pt c/o sore throat since Monday along with a fever of 100.9 and headaches. Has tried gargled salt water and tried aspirin which did not help.      Assessment & Plan:  1. Strep throat - Sending AMOX, advised on use & SE. Advised pt to take Ibuprofen '600mg'$  tid prn for sore throat pain, swelling, and fever. Gargle with warm salt water several tid. OK to use OTC Chloraseptic spray and/or throat lozenges prn. Drink plenty of water.    - POCT rapid strep A - amoxicillin (AMOXIL) 500 MG capsule; Take 1 capsule (500 mg total) by mouth 2 (two) times daily for 10 days.  Dispense: 20 capsule; Refill: 0  Subjective:    Outpatient Medications Prior to Visit  Medication Sig Dispense Refill   fluocinonide (LIDEX) 0.05 % external solution Apply 1 application topically 2 (two) times daily. 60 mL 0   fluticasone (FLONASE) 50 MCG/ACT nasal spray Place 2 sprays into both nostrils daily. 16 g 6   levothyroxine (SYNTHROID) 75 MCG tablet TAKE 1 TABLET BY MOUTH EVERY DAY 90 tablet 0   meloxicam (MOBIC) 15 MG tablet TAKE 1 TABLET BY MOUTH DAILY. 30 tablet 0   montelukast (SINGULAIR) 10 MG tablet TAKE 1 TABLET BY MOUTH EVERYDAY AT BEDTIME 90 tablet 1   propranolol (INDERAL) 10 MG tablet Take 1 tablet (10 mg total) by mouth every 8 (eight) hours as needed. 30 tablet 6   No facility-administered medications prior to visit.   Past Medical History:  Diagnosis Date   Allergy    Cataract    Hypothyroidism    Seasonal allergies    SVT (supraventricular tachycardia)    Past Surgical History:  Procedure Laterality Date   ablation uterus  2005   VEIN LIGATION Left 2012   No Known Allergies     Objective:    Physical Exam Vitals and nursing note reviewed.  Constitutional:      Appearance: Normal appearance.  HENT:     Mouth/Throat:     Mouth: Mucous membranes are moist.     Pharynx: Posterior oropharyngeal erythema present. No pharyngeal swelling, oropharyngeal exudate or uvula swelling.     Tonsils: No tonsillar exudate or tonsillar abscesses.  Cardiovascular:     Rate and Rhythm: Normal rate and regular rhythm.  Pulmonary:     Effort: Pulmonary effort is normal.     Breath sounds: Normal breath sounds.  Musculoskeletal:        General: Normal range of motion.  Lymphadenopathy:     Cervical: No cervical adenopathy.  Skin:    General: Skin is warm and dry.  Neurological:     Mental Status: She is alert.  Psychiatric:        Mood and Affect: Mood normal.        Behavior: Behavior normal.    BP 126/86 (BP Location: Left Arm, Patient Position: Sitting, Cuff Size: Large)   Pulse 80   Temp (!) 97.5 F (36.4 C) (Temporal)   Ht 5' 5.5" (1.664 m)   Wt 108 lb 2 oz (49 kg)   SpO2  96%   BMI 17.72 kg/m  Wt Readings from Last 3 Encounters:  06/30/22 108 lb 2 oz (49 kg)  11/02/21 113 lb (51.3 kg)  10/05/21 112 lb (50.8 kg)      Jeanie Sewer, NP

## 2022-07-12 ENCOUNTER — Encounter: Payer: Self-pay | Admitting: Family

## 2022-07-22 ENCOUNTER — Other Ambulatory Visit: Payer: Self-pay | Admitting: Family Medicine

## 2022-07-22 DIAGNOSIS — E039 Hypothyroidism, unspecified: Secondary | ICD-10-CM

## 2022-07-26 ENCOUNTER — Ambulatory Visit (INDEPENDENT_AMBULATORY_CARE_PROVIDER_SITE_OTHER): Payer: BC Managed Care – PPO | Admitting: Family Medicine

## 2022-07-26 ENCOUNTER — Encounter: Payer: Self-pay | Admitting: Family Medicine

## 2022-07-26 VITALS — BP 120/77 | HR 74 | Temp 98.0°F | Ht 65.5 in | Wt 106.4 lb

## 2022-07-26 DIAGNOSIS — J309 Allergic rhinitis, unspecified: Secondary | ICD-10-CM

## 2022-07-26 DIAGNOSIS — E785 Hyperlipidemia, unspecified: Secondary | ICD-10-CM | POA: Diagnosis not present

## 2022-07-26 DIAGNOSIS — Z0001 Encounter for general adult medical examination with abnormal findings: Secondary | ICD-10-CM | POA: Diagnosis not present

## 2022-07-26 DIAGNOSIS — Z131 Encounter for screening for diabetes mellitus: Secondary | ICD-10-CM | POA: Diagnosis not present

## 2022-07-26 DIAGNOSIS — R636 Underweight: Secondary | ICD-10-CM | POA: Diagnosis not present

## 2022-07-26 DIAGNOSIS — L309 Dermatitis, unspecified: Secondary | ICD-10-CM

## 2022-07-26 DIAGNOSIS — R002 Palpitations: Secondary | ICD-10-CM

## 2022-07-26 DIAGNOSIS — E039 Hypothyroidism, unspecified: Secondary | ICD-10-CM

## 2022-07-26 LAB — LIPID PANEL
Cholesterol: 192 mg/dL (ref 0–200)
HDL: 82.4 mg/dL (ref >39.00)
LDL Cholesterol: 95 mg/dL (ref 0–99)
NonHDL: 109.8
Total CHOL/HDL Ratio: 2
Triglycerides: 75 mg/dL (ref 0.0–149.0)
VLDL: 15 mg/dL (ref 0.0–40.0)

## 2022-07-26 LAB — HEMOGLOBIN A1C: Hgb A1c MFr Bld: 5.3 % (ref 4.6–6.5)

## 2022-07-26 LAB — COMPREHENSIVE METABOLIC PANEL
ALT: 13 U/L (ref 0–35)
AST: 18 U/L (ref 0–37)
Albumin: 4.5 g/dL (ref 3.5–5.2)
Alkaline Phosphatase: 42 U/L (ref 39–117)
BUN: 16 mg/dL (ref 6–23)
CO2: 30 mEq/L (ref 19–32)
Calcium: 10.3 mg/dL (ref 8.4–10.5)
Chloride: 102 mEq/L (ref 96–112)
Creatinine, Ser: 0.73 mg/dL (ref 0.40–1.20)
GFR: 88.18 mL/min (ref >60.00)
Glucose, Bld: 95 mg/dL (ref 70–99)
Potassium: 4.4 mEq/L (ref 3.5–5.1)
Sodium: 142 mEq/L (ref 135–145)
Total Bilirubin: 0.6 mg/dL (ref 0.2–1.2)
Total Protein: 7 g/dL (ref 6.0–8.3)

## 2022-07-26 LAB — CBC
HCT: 40.3 % (ref 36.0–46.0)
Hemoglobin: 13.5 g/dL (ref 12.0–15.0)
MCHC: 33.5 g/dL (ref 30.0–36.0)
MCV: 94 fl (ref 78.0–100.0)
Platelets: 259 10*3/uL (ref 150.0–400.0)
RBC: 4.29 Mil/uL (ref 3.87–5.11)
RDW: 14.3 % (ref 11.5–15.5)
WBC: 5.5 10*3/uL (ref 4.0–10.5)

## 2022-07-26 LAB — TSH: TSH: 1.11 u[IU]/mL (ref 0.35–5.50)

## 2022-07-26 MED ORDER — FLUTICASONE PROPIONATE 50 MCG/ACT NA SUSP: 2.0000 | Freq: Every day | NASAL | 6 refills | Status: AC

## 2022-07-26 MED ORDER — AZELASTINE HCL 0.1 % NA SOLN: 2.0000 | Freq: Two times a day (BID) | NASAL | 12 refills | Status: AC

## 2022-07-26 MED ORDER — FLUOCINONIDE 0.05 % EX SOLN: 1.0000 | Freq: Two times a day (BID) | CUTANEOUS | 0 refills | Status: AC

## 2022-07-26 MED ORDER — LEVOTHYROXINE SODIUM 75 MCG PO TABS: ORAL_TABLET | ORAL | 0 refills | Status: DC

## 2022-07-26 NOTE — Assessment & Plan Note (Addendum)
BMI 17.4 today.  She is down about 5 pounds over last year.  She is concerned about possible decrease in muscle mass now that she is weight training.  We will check labs today.  She will continue to monitor at home and let us know if continues to have weight loss. She will restart resistance training

## 2022-07-26 NOTE — Assessment & Plan Note (Signed)
Likely contributing to above eustachian tube dysfunction.  Continue Flonase and Singulair.  Will add on Astelin as above.

## 2022-07-26 NOTE — Assessment & Plan Note (Signed)
On Synthroid 75 mcg daily.  Check TSH.

## 2022-07-26 NOTE — Progress Notes (Addendum)
Chief Complaint:  Jordan Anderson is a 62 y.o. female who presents today for her annual comprehensive physical exam.    Assessment/Plan:  New/Acute Problems: Eustachian Tube Dysfunction  Likely related to her recent URI.  She does have some retraction of TMs on exam today.  Will add on Astelin.  She can continue her Flonase and Singulair.  Also recommended gentle auto insufflation.  She will let me know if not improving the next few weeks.  Will consider referral to ENT at that time.  Chronic Problems Addressed Today: Underweight BMI 17.4 today.  She is down about 5 pounds over last year.  She is concerned about possible decrease in muscle mass now that she is weight training.  We will check labs today.  She will continue to monitor at home and let us know if continues to have weight loss. She will restart resistance training  Hypothyroidism On Synthroid 75 mcg daily.  Check TSH.  Eczema Refilled fluocinonide.   Allergic rhinitis Likely contributing to above eustachian tube dysfunction.  Continue Flonase and Singulair.  Will add on Astelin as above.  Dyslipidemia Check labs.  Discussed lifestyle modifications.  Preventative Healthcare: Check labs.  She will come back for second shingles vaccine.  Due for Pap and mammogram later this year.  Colonoscopy due next year.  Patient Counseling(The following topics were reviewed and/or handout was given):  -Nutrition: Stressed importance of moderation in sodium/caffeine intake, saturated fat and cholesterol, caloric balance, sufficient intake of fresh fruits, vegetables, and fiber.  -Stressed the importance of regular exercise.   -Substance Abuse: Discussed cessation/primary prevention of tobacco, alcohol, or other drug use; driving or other dangerous activities under the influence; availability of treatment for abuse.   -Injury prevention: Discussed safety belts, safety helmets, smoke detector, smoking near bedding or upholstery.    -Sexuality: Discussed sexually transmitted diseases, partner selection, use of condoms, avoidance of unintended pregnancy and contraceptive alternatives.   -Dental health: Discussed importance of regular tooth brushing, flossing, and dental visits.  -Health maintenance and immunizations reviewed. Please refer to Health maintenance section.  Return to care in 1 year for next preventative visit.     Subjective:  HPI:  She has no acute complaints today.   She had a URI about a month ago. Saw a different provider here. Diagnosed with strep throat. Started on amoxicillin. Soon after started developing more congestion and pressure in her ears. Tried flonase without much improvement.   Lifestyle Diet: Balanced. Plenty of fruits and vegetables.  Exercise: Walk daily. Plays tennis.      07/26/2022    1:19 PM  Depression screen PHQ 2/9  Decreased Interest 0  Down, Depressed, Hopeless 0  PHQ - 2 Score 0    Health Maintenance Due  Topic Date Due   Zoster Vaccines- Shingrix (2 of 2) 09/16/2021     ROS: Per HPI, otherwise a complete review of systems was negative.   PMH:  The following were reviewed and entered/updated in epic: Past Medical History:  Diagnosis Date   Allergy    Cataract    Hypothyroidism    Seasonal allergies    SVT (supraventricular tachycardia)    Patient Active Problem List   Diagnosis Date Noted   Underweight 07/26/2022   Dyslipidemia 07/23/2021   Palpitations 07/22/2021   Osteopenia after menopause 02/13/2020   Eczema 06/25/2018   History of colonic polyps 05/08/2009   VARICOSE VEINS, LOWER EXTREMITIES 03/21/2008   Hypothyroidism 01/23/2007   Allergic rhinitis 01/23/2007  Past Surgical History:  Procedure Laterality Date   ablation uterus  2005   VEIN LIGATION Left 2012    Family History  Problem Relation Age of Onset   Breast cancer Mother    Cancer Father    Prostate cancer Father    Colon cancer Neg Hx    Esophageal cancer Neg Hx     Rectal cancer Neg Hx    Stomach cancer Neg Hx     Medications- reviewed and updated Current Outpatient Medications  Medication Sig Dispense Refill   azelastine (ASTELIN) 0.1 % nasal spray Place 2 sprays into both nostrils 2 (two) times daily. 30 mL 12   meloxicam (MOBIC) 15 MG tablet TAKE 1 TABLET BY MOUTH DAILY. 30 tablet 0   montelukast (SINGULAIR) 10 MG tablet TAKE 1 TABLET BY MOUTH EVERYDAY AT BEDTIME 90 tablet 1   propranolol (INDERAL) 10 MG tablet Take 1 tablet (10 mg total) by mouth every 8 (eight) hours as needed. 30 tablet 6   fluocinonide (LIDEX) 0.05 % external solution Apply 1 Application topically 2 (two) times daily. 60 mL 0   fluticasone (FLONASE) 50 MCG/ACT nasal spray Place 2 sprays into both nostrils daily. 16 g 6   levothyroxine (SYNTHROID) 75 MCG tablet TAKE 1 TABLET BY MOUTH EVERY DAY 30 tablet 0   No current facility-administered medications for this visit.    Allergies-reviewed and updated No Known Allergies  Social History   Socioeconomic History   Marital status: Married    Spouse name: Not on file   Number of children: Not on file   Years of education: Not on file   Highest education level: Not on file  Occupational History   Not on file  Tobacco Use   Smoking status: Never   Smokeless tobacco: Never  Vaping Use   Vaping Use: Never used  Substance and Sexual Activity   Alcohol use: Yes    Alcohol/week: 10.0 standard drinks of alcohol    Types: 10 Glasses of wine per week   Drug use: No   Sexual activity: Yes    Birth control/protection: Surgical  Other Topics Concern   Not on file  Social History Narrative   Not on file   Social Determinants of Health   Financial Resource Strain: Not on file  Food Insecurity: Not on file  Transportation Needs: Not on file  Physical Activity: Not on file  Stress: Not on file  Social Connections: Not on file        Objective:  Physical Exam: BP 120/77   Pulse 74   Temp 98 F (36.7 C) (Temporal)    Ht 5' 5.5" (1.664 m)   Wt 106 lb 6.4 oz (48.3 kg)   SpO2 97%   BMI 17.44 kg/m   Body mass index is 17.44 kg/m. Wt Readings from Last 3 Encounters:  07/26/22 106 lb 6.4 oz (48.3 kg)  06/30/22 108 lb 2 oz (49 kg)  11/02/21 113 lb (51.3 kg)   Gen: NAD, resting comfortably HEENT: TMs retracted bilaterally.  No effusion.. OP clear. No thyromegaly noted.  CV: RRR with no murmurs appreciated Pulm: NWOB, CTAB with no crackles, wheezes, or rhonchi GI: Normal bowel sounds present. Soft, Nontender, Nondistended. MSK: no edema, cyanosis, or clubbing noted Skin: warm, dry Neuro: CN2-12 grossly intact. Strength 5/5 in upper and lower extremities. Reflexes symmetric and intact bilaterally.  Psych: Normal affect and thought content     Rai Severns M. [name], MD 07/26/2022 1:56 PM

## 2022-07-26 NOTE — Assessment & Plan Note (Signed)
Check labs.  Discussed lifestyle modifications. 

## 2022-07-26 NOTE — Patient Instructions (Signed)
It was very nice to see you today!  You probably have some negative pressure on your ears.  This is probably related to your recent upper respiratory infection.  Please take the Astelin in addition to the Flonase.  Please gently pop up with your ears 20-30 times daily.  Let me know if not improving in the next few weeks.   Will check blood work today.  Please keep an eye on your weight and let us know if you have any other weight changes.  Please come back soon for your shingles vaccine.  We will see you back in year for your next physical.  Come back to see [place] sooner if needed.   Take care, Dr [name]  PLEASE NOTE:  If you had any lab tests, please let us know if you have not heard back within a few days. You may see your results on mychart before we have a chance to review them but we will give you a call once they are reviewed by [place].   If we ordered any referrals today, please let us know if you have not heard from their office within the next week.   If you had any urgent prescriptions sent in today, please check with the pharmacy within an hour of our visit to make sure the prescription was transmitted appropriately.   Please try these tips to maintain a healthy lifestyle:  Eat at least 3 REAL meals and 1-2 snacks per day.  Aim for no more than 5 hours between eating.  If you eat breakfast, please do so within one hour of getting up.   Each meal should contain half fruits/vegetables, one quarter protein, and one quarter carbs (no bigger than a computer mouse)  Cut down on sweet beverages. This includes juice, soda, and sweet tea.   Drink at least 1 glass of water with each meal and aim for at least 8 glasses per day  Exercise at least 150 minutes every week.    Preventive Care 32-63 Years Old, Female Preventive care refers to lifestyle choices and visits with your health care provider that can promote health and wellness. Preventive care visits are also called wellness  exams. What can I expect for my preventive care visit? Counseling Your health care provider may ask you questions about your: Medical history, including: Past medical problems. Family medical history. Pregnancy history. Current health, including: Menstrual cycle. Method of birth control. Emotional well-being. Home life and relationship well-being. Sexual activity and sexual health. Lifestyle, including: Alcohol, nicotine or tobacco, and drug use. Access to firearms. Diet, exercise, and sleep habits. Work and work Statistician. Sunscreen use. Safety issues such as seatbelt and bike helmet use. Physical exam Your health care provider will check your: Height and weight. These may be used to calculate your BMI (body mass index). BMI is a measurement that tells if you are at a healthy weight. Waist circumference. This measures the distance around your waistline. This measurement also tells if you are at a healthy weight and may help predict your risk of certain diseases, such as type 2 diabetes and high blood pressure. Heart rate and blood pressure. Body temperature. Skin for abnormal spots. What immunizations do I need?  Vaccines are usually given at various ages, according to a schedule. Your health care provider will recommend vaccines for you based on your age, medical history, and lifestyle or other factors, such as travel or where you work. What tests do I need? Screening Your health care provider  may recommend screening tests for certain conditions. This may include: Lipid and cholesterol levels. Diabetes screening. This is done by checking your blood sugar (glucose) after you have not eaten for a while (fasting). Pelvic exam and Pap test. Hepatitis B test. Hepatitis C test. HIV (human immunodeficiency virus) test. STI (sexually transmitted infection) testing, if you are at risk. Lung cancer screening. Colorectal cancer screening. Mammogram. Talk with your health care  provider about when you should start having regular mammograms. This may depend on whether you have a family history of breast cancer. BRCA-related cancer screening. This may be done if you have a family history of breast, ovarian, tubal, or peritoneal cancers. Bone density scan. This is done to screen for osteoporosis. Talk with your health care provider about your test results, treatment options, and if necessary, the need for more tests. Follow these instructions at home: Eating and drinking  Eat a diet that includes fresh fruits and vegetables, whole grains, lean protein, and low-fat dairy products. Take vitamin and mineral supplements as recommended by your health care provider. Do not drink alcohol if: Your health care provider tells you not to drink. You are pregnant, may be pregnant, or are planning to become pregnant. If you drink alcohol: Limit how much you have to 0-1 drink a day. Know how much alcohol is in your drink. In the [place] one drink equals one 12 oz bottle of beer (355 mL), one 5 oz glass of wine (148 mL), or one 1 oz glass of hard liquor (44 mL). Lifestyle Brush your teeth every morning and night with fluoride toothpaste. Floss one time each day. Exercise for at least 30 minutes 5 or more days each week. Do not use any products that contain nicotine or tobacco. These products include cigarettes, chewing tobacco, and vaping devices, such as e-cigarettes. If you need help quitting, ask your health care provider. Do not use drugs. If you are sexually active, practice safe sex. Use a condom or other form of protection to prevent STIs. If you do not wish to become pregnant, use a form of birth control. If you plan to become pregnant, see your health care provider for a prepregnancy visit. Take aspirin only as told by your health care provider. Make sure that you understand how much to take and what form to take. Work with your health care provider to find out whether it is  safe and beneficial for you to take aspirin daily. Find healthy ways to manage stress, such as: Meditation, yoga, or listening to music. Journaling. Talking to a trusted person. Spending time with friends and family. Minimize exposure to UV radiation to reduce your risk of skin cancer. Safety Always wear your seat belt while driving or riding in a vehicle. Do not drive: If you have been drinking alcohol. Do not ride with someone who has been drinking. When you are tired or distracted. While texting. If you have been using any mind-altering substances or drugs. Wear a helmet and other protective equipment during sports activities. If you have firearms in your house, make sure you follow all gun safety procedures. Seek help if you have been physically or sexually abused. What's next? Visit your health care provider once a year for an annual wellness visit. Ask your health care provider how often you should have your eyes and teeth checked. Stay up to date on all vaccines. This information is not intended to replace advice given to you by your health care provider. Make sure you discuss  any questions you have with your health care provider. Document Revised: 11/04/2020 Document Reviewed: 11/04/2020 Elsevier Patient Education  2023 [employer]  Preventive Care 73-69 Years Old, Female Preventive care refers to lifestyle choices and visits with your health care provider that can promote health and wellness. Preventive care visits are also called wellness exams. What can I expect for my preventive care visit? Counseling Your health care provider may ask you questions about your: Medical history, including: Past medical problems. Family medical history. Pregnancy history. Current health, including: Menstrual cycle. Method of birth control. Emotional well-being. Home life and relationship well-being. Sexual activity and sexual health. Lifestyle, including: Alcohol, nicotine or  tobacco, and drug use. Access to firearms. Diet, exercise, and sleep habits. Work and work Statistician. Sunscreen use. Safety issues such as seatbelt and bike helmet use. Physical exam Your health care provider will check your: Height and weight. These may be used to calculate your BMI (body mass index). BMI is a measurement that tells if you are at a healthy weight. Waist circumference. This measures the distance around your waistline. This measurement also tells if you are at a healthy weight and may help predict your risk of certain diseases, such as type 2 diabetes and high blood pressure. Heart rate and blood pressure. Body temperature. Skin for abnormal spots. What immunizations do I need?  Vaccines are usually given at various ages, according to a schedule. Your health care provider will recommend vaccines for you based on your age, medical history, and lifestyle or other factors, such as travel or where you work. What tests do I need? Screening Your health care provider may recommend screening tests for certain conditions. This may include: Lipid and cholesterol levels. Diabetes screening. This is done by checking your blood sugar (glucose) after you have not eaten for a while (fasting). Pelvic exam and Pap test. Hepatitis B test. Hepatitis C test. HIV (human immunodeficiency virus) test. STI (sexually transmitted infection) testing, if you are at risk. Lung cancer screening. Colorectal cancer screening. Mammogram. Talk with your health care provider about when you should start having regular mammograms. This may depend on whether you have a family history of breast cancer. BRCA-related cancer screening. This may be done if you have a family history of breast, ovarian, tubal, or peritoneal cancers. Bone density scan. This is done to screen for osteoporosis. Talk with your health care provider about your test results, treatment options, and if necessary, the need for more  tests. Follow these instructions at home: Eating and drinking  Eat a diet that includes fresh fruits and vegetables, whole grains, lean protein, and low-fat dairy products. Take vitamin and mineral supplements as recommended by your health care provider. Do not drink alcohol if: Your health care provider tells you not to drink. You are pregnant, may be pregnant, or are planning to become pregnant. If you drink alcohol: Limit how much you have to 0-1 drink a day. Know how much alcohol is in your drink. In the [place] one drink equals one 12 oz bottle of beer (355 mL), one 5 oz glass of wine (148 mL), or one 1 oz glass of hard liquor (44 mL). Lifestyle Brush your teeth every morning and night with fluoride toothpaste. Floss one time each day. Exercise for at least 30 minutes 5 or more days each week. Do not use any products that contain nicotine or tobacco. These products include cigarettes, chewing tobacco, and vaping devices, such as e-cigarettes. If you need help quitting, ask your  health care provider. Do not use drugs. If you are sexually active, practice safe sex. Use a condom or other form of protection to prevent STIs. If you do not wish to become pregnant, use a form of birth control. If you plan to become pregnant, see your health care provider for a prepregnancy visit. Take aspirin only as told by your health care provider. Make sure that you understand how much to take and what form to take. Work with your health care provider to find out whether it is safe and beneficial for you to take aspirin daily. Find healthy ways to manage stress, such as: Meditation, yoga, or listening to music. Journaling. Talking to a trusted person. Spending time with friends and family. Minimize exposure to UV radiation to reduce your risk of skin cancer. Safety Always wear your seat belt while driving or riding in a vehicle. Do not drive: If you have been drinking alcohol. Do not ride with someone  who has been drinking. When you are tired or distracted. While texting. If you have been using any mind-altering substances or drugs. Wear a helmet and other protective equipment during sports activities. If you have firearms in your house, make sure you follow all gun safety procedures. Seek help if you have been physically or sexually abused. What's next? Visit your health care provider once a year for an annual wellness visit. Ask your health care provider how often you should have your eyes and teeth checked. Stay up to date on all vaccines. This information is not intended to replace advice given to you by your health care provider. Make sure you discuss any questions you have with your health care provider. Document Revised: 11/04/2020 Document Reviewed: 11/04/2020 Elsevier Patient Education  [employer].

## 2022-07-26 NOTE — Assessment & Plan Note (Signed)
Refilled fluocinonide.

## 2022-07-28 ENCOUNTER — Ambulatory Visit: Payer: BC Managed Care – PPO

## 2022-07-29 NOTE — Progress Notes (Signed)
Please inform patient of the following:  Her labs are all stable.  Cholesterol is better than last year.  Do not need to make any changes to treatment plan at this time.  She should continue to work on diet and exercise and we can recheck everything in a year.

## 2022-08-04 ENCOUNTER — Ambulatory Visit: Payer: BC Managed Care – PPO

## 2022-08-11 ENCOUNTER — Ambulatory Visit (INDEPENDENT_AMBULATORY_CARE_PROVIDER_SITE_OTHER): Payer: BC Managed Care – PPO

## 2022-08-11 DIAGNOSIS — Z23 Encounter for immunization: Secondary | ICD-10-CM | POA: Diagnosis not present

## 2022-08-11 NOTE — Progress Notes (Signed)
Pt received 2nd Shingrix vaccine in left deltoid, pt tolerated well

## 2022-09-10 ENCOUNTER — Other Ambulatory Visit: Payer: Self-pay | Admitting: Family Medicine

## 2022-09-10 DIAGNOSIS — E039 Hypothyroidism, unspecified: Secondary | ICD-10-CM

## 2023-01-24 ENCOUNTER — Other Ambulatory Visit: Payer: Self-pay | Admitting: Family Medicine

## 2023-01-24 DIAGNOSIS — Z1231 Encounter for screening mammogram for malignant neoplasm of breast: Secondary | ICD-10-CM

## 2023-01-27 NOTE — Progress Notes (Unsigned)
   Jordan Anderson is a 62 y.o. female here for a routine exam.  Current complaints: none.  Husband undergoing treatment for glioblastoma.    Gynecologic History No LMP recorded. Patient is postmenopausal. Contraception: post menopausal status Last Mammogram: scheduled for 03/02/23 Last Pap Smear:  02/03/20- negative Last Colon Screening;  less than 5 years ago per patient. Seat Belts:   yes Sun Screen:   yes Dental Check Up:  yes Brush & Floss:  yes     Obstetric History OB History  Gravida Para Term Preterm AB Living  3 2 2   1 2   SAB IAB Ectopic Multiple Live Births    1          # Outcome Date GA Lbr Len/2nd Weight Sex Type Anes PTL Lv  3 IAB           2 Term           1 Term              The following portions of the patient's history were reviewed and updated as appropriate: allergies, current medications, past family history, past medical history, past social history, past surgical history, and problem list.  Review of Systems Pertinent items noted in HPI and remainder of comprehensive ROS otherwise negative.    Objective:     Vitals:   01/30/23 1304  BP: 129/80  Pulse: 79  Weight: 106 lb (48.1 kg)  Height: 5' 5.5" (1.664 m)   Vitals:  WNL General appearance: alert, cooperative and no distress  HEENT: Normocephalic, without obvious abnormality, atraumatic Eyes: negative Throat: lips, mucosa, and tongue normal; teeth and gums normal  Respiratory: Clear to auscultation bilaterally  CV: Regular rate and rhythm  Breasts:  Normal appearance, no masses or tenderness, no nipple retraction or dimpling  GI: Soft, non-tender; bowel sounds normal; no masses,  no organomegaly  GU: External Genitalia:  Tanner V, no lesion Urethra:  No prolapse, small skin tag unchanged from prior exams  Vagina: Pink, normal rugae, no blood or discharge  Cervix: No CMT, no lesion  Uterus:  Normal size and contour, non tender  Adnexa: Normal, no masses, non tender   Musculoskeletal: No edema, redness or tenderness in the calves or thighs  Skin: No lesions or rash  Lymphatic: Axillary adenopathy: none     Psychiatric: Normal mood and behavior        Assessment:    Healthy female exam.    Plan:    1.  Pap smear 2.  Yearly mammograms 3.  Dexa rpt--osteopenia; calcium worksheet given 4.  Colonoscopy q5

## 2023-01-30 ENCOUNTER — Ambulatory Visit (INDEPENDENT_AMBULATORY_CARE_PROVIDER_SITE_OTHER): Payer: BC Managed Care – PPO | Admitting: Obstetrics & Gynecology

## 2023-01-30 ENCOUNTER — Other Ambulatory Visit (HOSPITAL_COMMUNITY)
Admission: RE | Admit: 2023-01-30 | Discharge: 2023-01-30 | Disposition: A | Discharge status: Home / Self Care | Payer: BC Managed Care – PPO | Source: Ambulatory Visit | Attending: Obstetrics & Gynecology | Admitting: Obstetrics & Gynecology

## 2023-01-30 ENCOUNTER — Encounter: Payer: Self-pay | Admitting: Obstetrics & Gynecology

## 2023-01-30 VITALS — BP 129/80 | HR 79 | Ht 65.5 in | Wt 106.0 lb

## 2023-01-30 DIAGNOSIS — Z01419 Encounter for gynecological examination (general) (routine) without abnormal findings: Secondary | ICD-10-CM | POA: Diagnosis not present

## 2023-01-30 DIAGNOSIS — Z780 Asymptomatic menopausal state: Secondary | ICD-10-CM

## 2023-01-30 DIAGNOSIS — M8580 Other specified disorders of bone density and structure, unspecified site: Secondary | ICD-10-CM | POA: Diagnosis not present

## 2023-02-01 LAB — CYTOLOGY - PAP
Comment: NEGATIVE
Diagnosis: NEGATIVE
High risk HPV: NEGATIVE

## 2023-02-08 ENCOUNTER — Ambulatory Visit (HOSPITAL_BASED_OUTPATIENT_CLINIC_OR_DEPARTMENT_OTHER)
Admission: RE | Admit: 2023-02-08 | Discharge: 2023-02-08 | Disposition: A | Discharge status: Home / Self Care | Payer: BC Managed Care – PPO | Source: Ambulatory Visit | Attending: Obstetrics & Gynecology | Admitting: Obstetrics & Gynecology

## 2023-02-08 DIAGNOSIS — Z780 Asymptomatic menopausal state: Secondary | ICD-10-CM | POA: Insufficient documentation

## 2023-02-08 DIAGNOSIS — M8589 Other specified disorders of bone density and structure, multiple sites: Secondary | ICD-10-CM | POA: Diagnosis not present

## 2023-02-08 DIAGNOSIS — M8580 Other specified disorders of bone density and structure, unspecified site: Secondary | ICD-10-CM | POA: Insufficient documentation

## 2023-02-21 ENCOUNTER — Other Ambulatory Visit: Payer: Self-pay

## 2023-02-21 DIAGNOSIS — Z01419 Encounter for gynecological examination (general) (routine) without abnormal findings: Secondary | ICD-10-CM

## 2023-03-02 ENCOUNTER — Ambulatory Visit
Admission: RE | Admit: 2023-03-02 | Discharge: 2023-03-02 | Disposition: A | Discharge status: Home / Self Care | Payer: BC Managed Care – PPO | Source: Ambulatory Visit | Attending: Family Medicine | Admitting: Family Medicine

## 2023-03-02 DIAGNOSIS — Z1231 Encounter for screening mammogram for malignant neoplasm of breast: Secondary | ICD-10-CM | POA: Diagnosis not present

## 2023-03-04 DIAGNOSIS — Z20822 Contact with and (suspected) exposure to covid-19: Secondary | ICD-10-CM | POA: Diagnosis not present

## 2023-03-05 ENCOUNTER — Other Ambulatory Visit: Payer: Self-pay | Admitting: Family Medicine

## 2023-03-05 DIAGNOSIS — E039 Hypothyroidism, unspecified: Secondary | ICD-10-CM

## 2023-08-03 ENCOUNTER — Ambulatory Visit: Payer: Self-pay | Admitting: Family Medicine

## 2023-08-03 NOTE — Telephone Encounter (Signed)
 Chief Complaint: dysuria Symptoms: dysuria, vaginal pressure, cloudy urine Frequency: since last Wednesday Pertinent Negatives: Patient denies fever, abd pain, back pain, N/V, hematuria Disposition: [] ED /[] Urgent Care (no appt availability in office) / [x] Appointment(In office/virtual)/ []  [facility]/ [] Home Care/ [] Refused Recommended Disposition /[] [facility]/ []  Follow-up with PCP Additional Notes: Pt has had UTI symptoms since last Wednesday. States they improved and then worsened. Endorses burning with urination (5/10 pain) and pressure in her vaginal area. States she is going more frequently than typical but only a small amount of urine comes out each time. Pt denies fever, abd pain, back pain, hematuria, N/V. Does state her urine appears cloudy. Per protocol rN advised pt to be seen within 24 hours. RN scheduled pt for tomorrow at 1000 in the office, pt agreeable to that plan. RN advised pt she needs to call us back if she gets worse before then and she verbalized understanding.   Copied from CRM [id number]. Topic: Clinical - Red Word Triage >> Aug 03, 2023  9:10 AM Gurney Maxin H wrote: [facility] that prompted transfer to Nurse Triage: UTI pressure, pain and burning Reason for Disposition  Urinating more frequently than usual (i.e., frequency)  Answer Assessment - Initial Assessment Questions 1. SYMPTOM: "What's the main symptom you're concerned about?" (e.g., frequency, incontinence)     Burning, pressure, pain with urination 2. ONSET: "When did the pain start?"     Last Wednesday, went away, now it's back 3. PAIN: "Is there any pain?" If Yes, ask: "How bad is it?" (Scale: 1-10; mild, moderate, severe)     Yes - 4-5/10, comes and goes with urination, yesterday was worse 4. CAUSE: "What do you think is causing the symptoms?"     Possibly UTI 5. OTHER SYMPTOMS: "Do you have any other symptoms?" (e.g., blood in urine, fever, flank pain, pain with urination)     No  hematuria. Endorses pressure/pain in the vaginal area, pain with urination. Feels pain and burning after she has urinated, feels like she needs to keep urinating. Denies fever. No back pain. Denies N/V. 6. PREGNANCY: "Is there any chance you are pregnant?" "When was your last menstrual period?"  Protocols used: Urinary Symptoms-A-AH

## 2023-08-04 ENCOUNTER — Encounter: Payer: Self-pay | Admitting: Family Medicine

## 2023-08-04 ENCOUNTER — Ambulatory Visit (INDEPENDENT_AMBULATORY_CARE_PROVIDER_SITE_OTHER): Admitting: Family Medicine

## 2023-08-04 VITALS — BP 115/78 | HR 64 | Temp 97.3°F | Ht 65.5 in | Wt 103.8 lb

## 2023-08-04 DIAGNOSIS — E785 Hyperlipidemia, unspecified: Secondary | ICD-10-CM | POA: Diagnosis not present

## 2023-08-04 DIAGNOSIS — N390 Urinary tract infection, site not specified: Secondary | ICD-10-CM

## 2023-08-04 DIAGNOSIS — J309 Allergic rhinitis, unspecified: Secondary | ICD-10-CM | POA: Diagnosis not present

## 2023-08-04 DIAGNOSIS — E039 Hypothyroidism, unspecified: Secondary | ICD-10-CM

## 2023-08-04 LAB — POCT URINALYSIS DIPSTICK
Bilirubin, UA: NEGATIVE
Blood, UA: NEGATIVE
Glucose, UA: NEGATIVE
Ketones, UA: NEGATIVE
Leukocytes, UA: NEGATIVE
Nitrite, UA: NEGATIVE
Protein, UA: NEGATIVE
Spec Grav, UA: 1.01 (ref 1.010–1.025)
Urobilinogen, UA: 0.2 U/dL
pH, UA: 6 (ref 5.0–8.0)

## 2023-08-04 MED ORDER — NITROFURANTOIN MONOHYD MACRO 100 MG PO CAPS: 100.0000 mg | ORAL_CAPSULE | Freq: Two times a day (BID) | ORAL | 0 refills | Status: DC

## 2023-08-04 NOTE — Progress Notes (Signed)
   Jordan Anderson is a 63 y.o. female who presents today for an office visit.  Assessment/Plan:  New/Acute Problems: Dysuria Point-of-care UA negative however symptoms are consistent with UTI.  No red flags or signs of systemic illness.  Will empirically start Macrobid 100 mg twice daily for a week and await culture results.  Encouraged hydration.  We discussed reasons to return to care.  Chronic Problems Addressed Today: Hypothyroidism On Synthroid 75 mcg daily.  Will check TSH when she comes back later this year for CPE.  Allergic rhinitis Stable on Flonase and Singulair.  Dyslipidemia She will come back later this year for CPE and we will check lipids at that time.     Subjective:  HPI:  See Assessment / plan for status of chronic conditions.  Patient is here today with a week of UTI symptoms including dysuria and pressure.  Also increased frequency.  No hematuria.  No fevers or chills.  No abdominal pain.  No back pain. Symptoms come and go.        Objective:  Physical Exam: BP 115/78   Pulse 64   Temp (!) 97.3 F (36.3 C) (Temporal)   Ht 5' 5.5" (1.664 m)   Wt 103 lb 12.8 oz (47.1 kg)   SpO2 99%   BMI 17.01 kg/m   Gen: No acute distress, resting comfortably CV: Regular rate and rhythm with no murmurs appreciated Pulm: Normal work of breathing, clear to auscultation bilaterally with no crackles, wheezes, or rhonchi Neuro: Grossly normal, moves all extremities Psych: Normal affect and thought content      Ovila Lepage M. Jimmey Ralph, MD 08/04/2023 10:22 AM

## 2023-08-04 NOTE — Patient Instructions (Signed)
 It was very nice to see you today!  You probably have a UTI.  Start the antibiotic.  We will contact you once we get your urine culture results back.  Please make sure that you are getting plenty of fluids and staying hydrated.  Return if symptoms worsen or fail to improve.   Take care, Dr Jimmey Ralph  PLEASE NOTE:  If you had any lab tests, please let us know if you have not heard back within a few days. You may see your results on mychart before we have a chance to review them but we will give you a call once they are reviewed by [place].   If we ordered any referrals today, please let us know if you have not heard from their office within the next week.   If you had any urgent prescriptions sent in today, please check with the pharmacy within an hour of our visit to make sure the prescription was transmitted appropriately.   Please try these tips to maintain a healthy lifestyle:  Eat at least 3 REAL meals and 1-2 snacks per day.  Aim for no more than 5 hours between eating.  If you eat breakfast, please do so within one hour of getting up.   Each meal should contain half fruits/vegetables, one quarter protein, and one quarter carbs (no bigger than a computer mouse)  Cut down on sweet beverages. This includes juice, soda, and sweet tea.   Drink at least 1 glass of water with each meal and aim for at least 8 glasses per day  Exercise at least 150 minutes every week.  2

## 2023-08-04 NOTE — Assessment & Plan Note (Signed)
Stable on Flonase and Singulair

## 2023-08-04 NOTE — Assessment & Plan Note (Signed)
 She will come back later this year for CPE and we will check lipids at that time.

## 2023-08-04 NOTE — Assessment & Plan Note (Signed)
 On Synthroid 75 mcg daily.  Will check TSH when she comes back later this year for CPE.

## 2023-08-07 ENCOUNTER — Encounter: Payer: Self-pay | Admitting: Family Medicine

## 2023-08-07 LAB — URINE CULTURE
MICRO NUMBER:: 16202987
SPECIMEN QUALITY:: ADEQUATE

## 2023-08-07 NOTE — Progress Notes (Signed)
 Her urine culture confirms urinary tract infection.  The antibiotic we have her on should treat this.  She should let us know if symptoms are not improving.

## 2023-08-30 ENCOUNTER — Other Ambulatory Visit: Payer: Self-pay | Admitting: *Deleted

## 2023-08-30 DIAGNOSIS — E039 Hypothyroidism, unspecified: Secondary | ICD-10-CM

## 2023-08-30 MED ORDER — LEVOTHYROXINE SODIUM 75 MCG PO TABS: ORAL_TABLET | ORAL | 1 refills | Status: DC

## 2024-01-30 DIAGNOSIS — D2272 Melanocytic nevi of left lower limb, including hip: Secondary | ICD-10-CM | POA: Diagnosis not present

## 2024-01-30 DIAGNOSIS — D225 Melanocytic nevi of trunk: Secondary | ICD-10-CM | POA: Diagnosis not present

## 2024-01-30 DIAGNOSIS — I788 Other diseases of capillaries: Secondary | ICD-10-CM | POA: Diagnosis not present

## 2024-01-30 DIAGNOSIS — L821 Other seborrheic keratosis: Secondary | ICD-10-CM | POA: Diagnosis not present

## 2024-02-01 ENCOUNTER — Other Ambulatory Visit: Payer: Self-pay | Admitting: Family Medicine

## 2024-02-01 DIAGNOSIS — Z1231 Encounter for screening mammogram for malignant neoplasm of breast: Secondary | ICD-10-CM

## 2024-02-24 ENCOUNTER — Other Ambulatory Visit: Payer: Self-pay | Admitting: Family Medicine

## 2024-02-24 DIAGNOSIS — E039 Hypothyroidism, unspecified: Secondary | ICD-10-CM

## 2024-03-04 ENCOUNTER — Ambulatory Visit
Admission: RE | Admit: 2024-03-04 | Discharge: 2024-03-04 | Disposition: A | Discharge status: Home / Self Care | Source: Ambulatory Visit | Attending: Family Medicine | Admitting: Family Medicine

## 2024-03-04 DIAGNOSIS — Z1231 Encounter for screening mammogram for malignant neoplasm of breast: Secondary | ICD-10-CM

## 2024-04-29 ENCOUNTER — Encounter: Payer: Self-pay | Admitting: Obstetrics & Gynecology

## 2024-04-29 ENCOUNTER — Ambulatory Visit: Admitting: Obstetrics & Gynecology

## 2024-04-29 ENCOUNTER — Encounter: Payer: Self-pay | Admitting: Physician Assistant

## 2024-04-29 VITALS — BP 131/88 | HR 79 | Ht 65.5 in | Wt 107.0 lb

## 2024-04-29 DIAGNOSIS — R197 Diarrhea, unspecified: Secondary | ICD-10-CM | POA: Diagnosis not present

## 2024-04-29 DIAGNOSIS — Z01419 Encounter for gynecological examination (general) (routine) without abnormal findings: Secondary | ICD-10-CM | POA: Diagnosis not present

## 2024-04-29 DIAGNOSIS — Z780 Asymptomatic menopausal state: Secondary | ICD-10-CM | POA: Diagnosis not present

## 2024-04-29 DIAGNOSIS — M8580 Other specified disorders of bone density and structure, unspecified site: Secondary | ICD-10-CM

## 2024-04-29 NOTE — Progress Notes (Signed)
  Subjective:     Jordan Anderson is a 63 y.o. female here for a routine exam.  Current complaints: frequent BMs several a day--after eating.  Has lost weight this past year--lost heusband (glio) and mother (94)    No mucous, no blood in stool    Gynecologic History No LMP recorded. Patient is postmenopausal. Contraception: post menopausal status Last pap smear (date and result):02/01/23- Negative (neg hx of abnml paps) Last mammogram (date and result):03/06/24 Last colon screening (date and result):10/12/18 Brush:yes Floss:yes Seatbelts: yes Sunscreen: yes  Obstetric History OB History  Gravida Para Term Preterm AB Living  3 2 2  1 2   SAB IAB Ectopic Multiple Live Births   1       # Outcome Date GA Lbr Len/2nd Weight Sex Type Anes PTL Lv  3 IAB           2 Term           1 Term              The following portions of the patient's history were reviewed and updated as appropriate: allergies, current medications, past family history, past medical history, past social history, past surgical history, and problem list.  Review of Systems Pertinent items noted in HPI and remainder of comprehensive ROS otherwise negative.    Objective:     Vitals:   04/29/24 1324  BP: 131/88  Pulse: 79  Weight: 107 lb (48.5 kg)  Height: 5' 5.5 (1.664 m)   Vitals:  WNL General appearance: alert, cooperative and no distress  HEENT: Normocephalic, without obvious abnormality, atraumatic Eyes: negative Throat: lips, mucosa, and tongue normal; teeth and gums normal  Respiratory: Clear to auscultation bilaterally  CV: Regular rate and rhythm  Breasts:  Normal appearance, no masses or tenderness, no nipple retraction or dimpling  GI: Soft, non-tender; bowel sounds normal; no masses,  no organomegaly  GU: External Genitalia:  Tanner V, no lesion Urethra:  No prolapse   Vagina: Pink, normal rugae, no blood or discharge  Cervix: No CMT, no lesion  Uterus:  Normal size and contour, non  tender  Adnexa: Normal, no masses, non tender  Musculoskeletal: No edema, redness or tenderness in the calves or thighs  Skin: No lesions or rash  Lymphatic: Axillary adenopathy: none     Psychiatric: Normal mood and behavior        Assessment:    Healthy female exam.    Plan:    Pap and mammo up to date Needs colonoscopy--diarrhea needs eval by PCP or GI Osteopenia--continue to maximize calcium, vit D and weight bearing exercise.

## 2024-05-05 ENCOUNTER — Emergency Department (HOSPITAL_BASED_OUTPATIENT_CLINIC_OR_DEPARTMENT_OTHER)

## 2024-05-05 ENCOUNTER — Emergency Department (HOSPITAL_BASED_OUTPATIENT_CLINIC_OR_DEPARTMENT_OTHER)
Admission: EM | Admit: 2024-05-05 | Discharge: 2024-05-05 | Disposition: A | Discharge status: Home / Self Care | Attending: Emergency Medicine | Admitting: Emergency Medicine

## 2024-05-05 ENCOUNTER — Other Ambulatory Visit: Payer: Self-pay

## 2024-05-05 ENCOUNTER — Encounter (HOSPITAL_BASED_OUTPATIENT_CLINIC_OR_DEPARTMENT_OTHER): Payer: Self-pay | Admitting: *Deleted

## 2024-05-05 DIAGNOSIS — Y9301 Activity, walking, marching and hiking: Secondary | ICD-10-CM | POA: Diagnosis not present

## 2024-05-05 DIAGNOSIS — S6992XA Unspecified injury of left wrist, hand and finger(s), initial encounter: Secondary | ICD-10-CM | POA: Diagnosis not present

## 2024-05-05 DIAGNOSIS — Y92480 Sidewalk as the place of occurrence of the external cause: Secondary | ICD-10-CM | POA: Diagnosis not present

## 2024-05-05 DIAGNOSIS — W19XXXA Unspecified fall, initial encounter: Secondary | ICD-10-CM | POA: Diagnosis not present

## 2024-05-05 DIAGNOSIS — S62639A Displaced fracture of distal phalanx of unspecified finger, initial encounter for closed fracture: Secondary | ICD-10-CM

## 2024-05-05 DIAGNOSIS — S61402A Unspecified open wound of left hand, initial encounter: Secondary | ICD-10-CM | POA: Diagnosis not present

## 2024-05-05 DIAGNOSIS — M7989 Other specified soft tissue disorders: Secondary | ICD-10-CM | POA: Diagnosis not present

## 2024-05-05 DIAGNOSIS — S62633A Displaced fracture of distal phalanx of left middle finger, initial encounter for closed fracture: Secondary | ICD-10-CM | POA: Diagnosis not present

## 2024-05-05 NOTE — ED Provider Notes (Signed)
 [place] EMERGENCY DEPARTMENT AT [facility] Provider Note   CSN: [id number] Arrival date & time: 05/05/24  2044     Patient presents with: Finger Injury and Laceration   Jordan Anderson is a 63 y.o. female had a mechanical fall while walking outside last night, fell onto her hand and noted that she had displacement of the distal portion of the left middle finger.  She reduced this on her own, wrapped it with a bandage, and deferred assessment till today when she stated that due to swelling and some discomfort in the palmar aspect she was concerned and wanted to at least have x-ray imaging to evaluate for serious injury.  She also has a laceration to the volar aspect of the middle finger overlying the PIP joint.    Laceration      Prior to Admission medications  Medication Sig Start Date End Date Taking? Authorizing Provider  azelastine  (ASTELIN ) 0.1 % nasal spray Place 2 sprays into both nostrils 2 (two) times daily. Patient not taking: Reported on 04/29/2024 07/26/22   Kennyth Worth HERO, MD  fluocinonide  (LIDEX ) 0.05 % external solution Apply 1 Application topically 2 (two) times daily. Patient not taking: Reported on 04/29/2024 07/26/22   Kennyth Worth HERO, MD  fluticasone  (FLONASE ) 50 MCG/ACT nasal spray Place 2 sprays into both nostrils daily. Patient not taking: Reported on 04/29/2024 07/26/22   Kennyth Worth HERO, MD  levothyroxine  (SYNTHROID ) 75 MCG tablet TAKE 1 TABLET BY MOUTH EVERY DAY 02/26/24   Kennyth Worth HERO, MD  meloxicam  (MOBIC ) 15 MG tablet TAKE 1 TABLET BY MOUTH DAILY. Patient not taking: Reported on 04/29/2024 11/01/21   Brooks, Dana, DO  montelukast  (SINGULAIR ) 10 MG tablet TAKE 1 TABLET BY MOUTH EVERYDAY AT BEDTIME Patient not taking: Reported on 04/29/2024 12/01/21   Kennyth Worth HERO, MD  nitrofurantoin , macrocrystal-monohydrate, (MACROBID ) 100 MG capsule Take 1 capsule (100 mg total) by mouth 2 (two) times daily. Patient not taking: Reported on 04/29/2024 08/04/23    Kennyth Worth HERO, MD  propranolol  (INDERAL ) 10 MG tablet Take 1 tablet (10 mg total) by mouth every 8 (eight) hours as needed. Patient not taking: Reported on 04/29/2024 08/19/21   Lynwood Schilling, MD    Allergies: Patient has no known allergies.    Review of Systems  Musculoskeletal:  Positive for arthralgias.  Skin:  Positive for wound.  All other systems reviewed and are negative.   Updated Vital Signs BP 119/77   Pulse 65   Temp 98.3 F (36.8 C)   Resp 16   SpO2 97%   Physical Exam Vitals and nursing note reviewed.  Constitutional:      General: She is not in acute distress.    Appearance: She is well-developed.  HENT:     Head: Normocephalic and atraumatic.  Eyes:     Conjunctiva/sclera: Conjunctivae normal.  Cardiovascular:     Rate and Rhythm: Normal rate and regular rhythm.     Heart sounds: No murmur heard. Pulmonary:     Effort: Pulmonary effort is normal. No respiratory distress.     Breath sounds: Normal breath sounds.  Abdominal:     Palpations: Abdomen is soft.     Tenderness: There is no abdominal tenderness.  Musculoskeletal:        General: No swelling.     Right hand: Normal.     Left hand: Swelling, laceration and tenderness present.     Cervical back: Neck supple.     Comments: On the palmar aspect  of the left second digit, there is notable swelling and tenderness, and there is an overlying 1 cm laceration over the PIP joint.  Does not extend into the joint space.  No visible bone noted in the wound.  Skin:    General: Skin is warm and dry.     Capillary Refill: Capillary refill takes less than 2 seconds.  Neurological:     General: No focal deficit present.     Mental Status: She is alert and oriented to person, place, and time. Mental status is at baseline.     GCS: GCS eye subscore is 4. GCS verbal subscore is 5. GCS motor subscore is 6.     Comments: No focal deficits appreciated, there are no sensory deficits.  Intact sensation to the distal  fingers on bilateral hands.  Psychiatric:        Mood and Affect: Mood normal.     (all labs ordered are listed, but only abnormal results are displayed) Labs Reviewed - No data to display  EKG: None  Radiology: DG Finger Middle Left Result Date: 05/05/2024 EXAM: 3 VIEW(S) XRAY OF THE LEFT FINGER(S) 05/05/2024 09:26:00 PM COMPARISON: None available. CLINICAL HISTORY: Recent fall with swelling of the 3rd digit. FINDINGS: BONES AND JOINTS: A tiny avulsion is noted from the palmar aspect of the base of the 3rd distal phalanx. No other focal abnormality is noted. No malalignment. SOFT TISSUES: The soft tissues are unremarkable. IMPRESSION: 1. Tiny avulsion from the palmar aspect of the base of the 3rd distal phalanx, likely related to the recent fall. Electronically signed by: Oneil Devonshire MD 05/05/2024 09:30 PM EST RP Workstation: [id number]     Procedures   Medications Ordered in the ED - No data to display                                  Medical Decision Making Amount and/or Complexity of Data Reviewed Radiology: ordered.   Secondary to mechanism as well as presenting signs and symptoms, x-ray was ordered which does show a tiny avulsion at the palmar aspect at the base of the distal phalanx of the second digit of the left hand.  Otherwise no remarkable findings on imaging, regarding the wound on the palmar surface of the finger, there is some maceration around the wound edges, granular tissue noted within the wound bed, no visible bone, and probing with cotton applicator does not show any depth to the wound.  Given this, do not find indication for primary closure, also given timing from initial injury to high-five is amenable to primary closure.  Will place finger splint and refer for hand surgery consultation, also placed bacitracin and keep the wound clean and dry to allow for healing by secondary intent.  Was thoroughly discussed with the patient and they understand agree had no  further concerns at this time.     Final diagnoses:  Closed avulsion fracture of distal phalanx of finger, initial encounter    ED Discharge Orders     None          Myriam Dorn BROCKS, PA 05/06/24 BRYN    Jerrol Agent, MD 05/06/24 1350

## 2024-05-05 NOTE — Discharge Instructions (Signed)
 As discussed, please contact and follow-up with hand specialist to further assess the avulsion fracture to the finger.  Keep the area clean and dry, apply clean dressings, and return to the emergency department should you have any further concerns before being able to follow-up with your primary care and with a hand specialist.

## 2024-05-05 NOTE — ED Triage Notes (Signed)
 Pt to ED reporting a mechanical fall over the sidewalk last night. Patient fell on left hand with pain, swelling and laceration noted to middle finger. Pt stating her finger was crocked after fall but she popped it back straight.   Laceration on left middle finger is approx. 1in with clean edges.

## 2024-05-06 ENCOUNTER — Telehealth: Payer: Self-pay

## 2024-05-06 NOTE — Telephone Encounter (Signed)
 Transition Care Management Follow-up Telephone Call Date of discharge and from where: 05/05/24 [facility] How have you been since you were released from the hospital? Same Any questions or concerns? No  Items Reviewed: Did the pt receive and understand the discharge instructions provided? Yes  Medications obtained and verified? No  Other? No  Any new allergies since your discharge? No  Dietary orders reviewed? No Do you have support at home? Yes   Home Care and Equipment/Supplies: Were home health services ordered? not applicable If so, what is the name of the agency?   Has the agency set up a time to come to the patient's home? not applicable Were any new equipment or medical supplies ordered?  No What is the name of the medical supply agency?  Were you able to get the supplies/equipment? not applicable Do you have any questions related to the use of the equipment or supplies? No  Functional Questionnaire: (I = Independent and D = Dependent) ADLs: I  Bathing/Dressing- I  Meal Prep- I  Eating- I  Maintaining continence- I  Transferring/Ambulation- I  Managing Meds- I  Follow up appointments reviewed:  PCP Hospital f/u appt confirmed? No  Not needed Specialist Hospital f/u appt confirmed? Yes  Scheduled to see Arlinda Buster, MD  on 05/13/24 @ 9am. Are transportation arrangements needed? No  If their condition worsens, is the pt aware to call PCP or go to the Emergency Dept.? Yes Was the patient provided with contact information for the PCP's office or ED? Yes Was to pt encouraged to call back with questions or concerns? Yes

## 2024-05-06 NOTE — Telephone Encounter (Signed)
Scheduled for 12/22 at 9:00 am

## 2024-05-06 NOTE — Telephone Encounter (Signed)
 Patient LMVM on triage phone in regards to ER visit at [facility] for left middle finger injury. She was told to call and make an appointment in follow up with Dr Erwin. Please call her back to schedule. [phone]

## 2024-05-07 ENCOUNTER — Telehealth: Payer: Self-pay

## 2024-05-07 ENCOUNTER — Ambulatory Visit (INDEPENDENT_AMBULATORY_CARE_PROVIDER_SITE_OTHER): Admitting: Student

## 2024-05-07 DIAGNOSIS — S62639A Displaced fracture of distal phalanx of unspecified finger, initial encounter for closed fracture: Secondary | ICD-10-CM | POA: Diagnosis not present

## 2024-05-07 NOTE — Progress Notes (Signed)
 Chief Complaint: Left finger injury     History of Present Illness:    Jordan Anderson is a 63 y.o. female who presents today for evaluation of a left middle finger injury.  She sustained the injury due to a fall on 12/13 after her foot got caught on an uneven sidewalk.  Patient reports that immediately after the injury, her finger appeared slightly crooked although she was able to straighten this out herself.  She was seen the next day in the ED for evaluation, where x-rays demonstrated a small avulsion fracture.  There is a laceration present over the volar aspect of the finger which was treated with Steri-Strips as opposed to sutures.  Patient states that her finger appeared significantly swollen yesterday and today although has slightly improved.  She has a follow-up scheduled with Dr. Arlinda on 12/22, but would like to make sure that treatment does not need to change in the meantime.  She denies any numbness or tingling, fever, chills, or drainage from the wound.   Surgical History:   None  PMH/PSH/Family History/Social History/Meds/Allergies:    Past Medical History:  Diagnosis Date   Allergy    Cataract    Hypothyroidism    Seasonal allergies    SVT (supraventricular tachycardia)    Past Surgical History:  Procedure Laterality Date   ablation uterus  2005   VEIN LIGATION Left 2012   Social History   Socioeconomic History   Marital status: Married    Spouse name: Not on file   Number of children: Not on file   Years of education: Not on file   Highest education level: Bachelor's degree (e.g., BA, AB, BS)  Occupational History   Not on file  Tobacco Use   Smoking status: Never   Smokeless tobacco: Never  Vaping Use   Vaping status: Never Used  Substance and Sexual Activity   Alcohol use: Yes    Alcohol/week: 10.0 standard drinks of alcohol    Types: 10 Glasses of wine per week   Drug use: No   Sexual activity: Not  Currently    Birth control/protection: Surgical  Other Topics Concern   Not on file  Social History Narrative   Not on file   Social Drivers of Health   Tobacco Use: Low Risk (05/05/2024)   Patient History    Smoking Tobacco Use: Never    Smokeless Tobacco Use: Never    Passive Exposure: Not on file  Financial Resource Strain: Low Risk (08/03/2023)   Overall Financial Resource Strain (CARDIA)    Difficulty of Paying Living Expenses: Not hard at all  Food Insecurity: No Food Insecurity (08/03/2023)   Hunger Vital Sign    Worried About Running Out of Food in the Last Year: Never true    Ran Out of Food in the Last Year: Never true  Transportation Needs: No Transportation Needs (08/03/2023)   PRAPARE - Administrator, Civil Service (Medical): No    Lack of Transportation (Non-Medical): No  Physical Activity: Insufficiently Active (08/03/2023)   Exercise Vital Sign    Days of Exercise per Week: 3 days    Minutes of Exercise per Session: 40 min  Stress: No Stress Concern Present (08/03/2023)   [employer] of Occupational Health - Occupational Stress Questionnaire    Feeling of  Stress : Only a little  Social Connections: Socially Integrated (08/03/2023)   Social Connection and Isolation Panel    Frequency of Communication with Friends and Family: Once a week    Frequency of Social Gatherings with Friends and Family: Twice a week    Attends Religious Services: More than 4 times per year    Active Member of Clubs or Organizations: Yes    Attends Banker Meetings: More than 4 times per year    Marital Status: Married  Depression (PHQ2-9): Low Risk (08/04/2023)   Depression (PHQ2-9)    PHQ-2 Score: 0  Alcohol Screen: Low Risk (08/03/2023)   Alcohol Screen    Last Alcohol Screening Score (AUDIT): 4  Housing: Unknown (08/03/2023)   Housing Stability Vital Sign    Unable to Pay for Housing in the Last Year: No    Number of Times Moved in the Last Year: Not on  file    Homeless in the Last Year: No  Utilities: Not on file  Health Literacy: Not on file   Family History  Problem Relation Age of Onset   Breast cancer Mother    Cancer Father    Prostate cancer Father    Colon cancer Neg Hx    Esophageal cancer Neg Hx    Rectal cancer Neg Hx    Stomach cancer Neg Hx    Allergies[1] Current Outpatient Medications  Medication Sig Dispense Refill   azelastine  (ASTELIN ) 0.1 % nasal spray Place 2 sprays into both nostrils 2 (two) times daily. (Patient not taking: Reported on 04/29/2024) 30 mL 12   fluocinonide  (LIDEX ) 0.05 % external solution Apply 1 Application topically 2 (two) times daily. (Patient not taking: Reported on 04/29/2024) 60 mL 0   fluticasone  (FLONASE ) 50 MCG/ACT nasal spray Place 2 sprays into both nostrils daily. (Patient not taking: Reported on 04/29/2024) 16 g 6   levothyroxine  (SYNTHROID ) 75 MCG tablet TAKE 1 TABLET BY MOUTH EVERY DAY 90 tablet 1   meloxicam  (MOBIC ) 15 MG tablet TAKE 1 TABLET BY MOUTH DAILY. (Patient not taking: Reported on 04/29/2024) 30 tablet 0   montelukast  (SINGULAIR ) 10 MG tablet TAKE 1 TABLET BY MOUTH EVERYDAY AT BEDTIME (Patient not taking: Reported on 04/29/2024) 90 tablet 1   nitrofurantoin , macrocrystal-monohydrate, (MACROBID ) 100 MG capsule Take 1 capsule (100 mg total) by mouth 2 (two) times daily. (Patient not taking: Reported on 04/29/2024) 14 capsule 0   propranolol  (INDERAL ) 10 MG tablet Take 1 tablet (10 mg total) by mouth every 8 (eight) hours as needed. (Patient not taking: Reported on 04/29/2024) 30 tablet 6   No current facility-administered medications for this visit.   DG Finger Middle Left Result Date: 05/05/2024 EXAM: 3 VIEW(S) XRAY OF THE LEFT FINGER(S) 05/05/2024 09:26:00 PM COMPARISON: None available. CLINICAL HISTORY: Recent fall with swelling of the 3rd digit. FINDINGS: BONES AND JOINTS: A tiny avulsion is noted from the palmar aspect of the base of the 3rd distal phalanx. No other focal  abnormality is noted. No malalignment. SOFT TISSUES: The soft tissues are unremarkable. IMPRESSION: 1. Tiny avulsion from the palmar aspect of the base of the 3rd distal phalanx, likely related to the recent fall. Electronically signed by: Oneil Devonshire MD 05/05/2024 09:30 PM EST RP Workstation: [id number]    Review of Systems:   A ROS was performed including pertinent positives and negatives as documented in the HPI.  Physical Exam :   Constitutional: NAD and appears stated age Neurological: Alert and oriented Psych: Appropriate affect and  cooperative There were no vitals taken for this visit.   Comprehensive Musculoskeletal Exam:    Exam of the left middle finger shows mild swelling over the distal phalanx.  There is a laceration over the volar aspect of the distal phalanx just distal to the DIP without erythema or drainage.  Flexor mechanisms at the PIP and DIP are intact.  Brisk capillary refill to the fingertip.  Distal neurosensory exam intact.  Imaging:   Xray review from ED on 05/05/2024 (left middle finger 3 views): Small avulsion off of the volar base of the third distal phalanx   I personally reviewed and interpreted the radiographs.   Assessment:   63 y.o. female 3 days status post injury to the left third finger resulting in a small avulsion of the distal phalanx as well as a small laceration over the volar DIP.  The laceration is well-appearing today without any signs of infection.  No evidence of tendon or nerve injury.  Discussed that we could consider prophylactic antibiotics and that this would be recommended with a more significant fracture, however as this does look good on exam today I will have her keep the area clean and dry and monitor for any changes.  Patient was placed in a static splint today and will follow-up with hand surgery next week.  Plan :    - Follow-up with Dr. Agarwala as scheduled on 12/22     I personally saw and evaluated the patient, and  participated in the management and treatment plan.  Leonce Reveal, PA-C Orthopedics     [1] No Known Allergies

## 2024-05-07 NOTE — Telephone Encounter (Signed)
 This patient left a message on the triage phone  She was in the ED on the 14th with finger fracture She states it is blue and purple at the tip and is really worried about it and wants to be seen  [phone]

## 2024-05-09 NOTE — Progress Notes (Unsigned)
° °  Jordan Anderson - 63 y.o. female MRN [id number]  Date of birth: May 25, 1960  Office Visit Note: Visit Date: 05/13/2024 PCP: Kennyth Worth HERO, MD Referred by: Kennyth Worth HERO, MD  Subjective: No chief complaint on file.  HPI: Jordan Anderson is a pleasant 63 y.o. female who presents today for ***  Pertinent ROS were reviewed with the patient and found to be negative unless otherwise specified above in HPI.   Visit Reason: Duration of symptoms: Hand dominance: {RIGHT/LEFT:20294} Occupation: Diabetic: {yes/no:20286} Smoking: {yes/no:20286} Heart/Lung History: Blood Thinners:   Prior Testing/EMG: Injections (Date): Treatments: Prior Surgery:    Assessment & Plan: Visit Diagnoses: No diagnosis found.  Plan: ***  Follow-up: No follow-ups on file.   Meds & Orders: No orders of the defined types were placed in this encounter.  No orders of the defined types were placed in this encounter.    Procedures: No procedures performed      Clinical History: No specialty comments available.  She reports that she has never smoked. She has never used smokeless tobacco. No results for input(s): HGBA1C, LABURIC in the last 8760 hours.  Objective:   Vital Signs: There were no vitals taken for this visit.  Physical Exam  Gen: Well-appearing, in no acute distress; non-toxic CV: Regular Rate. Well-perfused. Warm.  Resp: Breathing unlabored on room air; no wheezing. Psych: Fluid speech in conversation; appropriate affect; normal thought process  Ortho Exam - ***   Imaging: No results found.  Past Medical/Family/Surgical/Social History: Medications & Allergies reviewed per EMR, new medications updated. Patient Active Problem List   Diagnosis Date Noted   Underweight 07/26/2022   Dyslipidemia 07/23/2021   Palpitations 07/22/2021   Osteopenia after menopause 02/13/2020   Eczema 06/25/2018   History of colonic polyps 05/08/2009   VARICOSE VEINS, LOWER  EXTREMITIES 03/21/2008   Hypothyroidism 01/23/2007   Allergic rhinitis 01/23/2007   Past Medical History:  Diagnosis Date   Allergy    Cataract    Hypothyroidism    Seasonal allergies    SVT (supraventricular tachycardia)    Family History  Problem Relation Age of Onset   Breast cancer Mother    Cancer Father    Prostate cancer Father    Colon cancer Neg Hx    Esophageal cancer Neg Hx    Rectal cancer Neg Hx    Stomach cancer Neg Hx    Past Surgical History:  Procedure Laterality Date   ablation uterus  2005   VEIN LIGATION Left 2012   Social History   Occupational History   Not on file  Tobacco Use   Smoking status: Never   Smokeless tobacco: Never  Vaping Use   Vaping status: Never Used  Substance and Sexual Activity   Alcohol use: Yes    Alcohol/week: 10.0 standard drinks of alcohol    Types: 10 Glasses of wine per week   Drug use: No   Sexual activity: Not Currently    Birth control/protection: Surgical    Vick Filter Estela) Arlinda, M.D. [facility], Hand Surgery

## 2024-05-13 ENCOUNTER — Ambulatory Visit: Admitting: Orthopedic Surgery

## 2024-05-13 DIAGNOSIS — S62639A Displaced fracture of distal phalanx of unspecified finger, initial encounter for closed fracture: Secondary | ICD-10-CM | POA: Diagnosis not present

## 2024-06-02 NOTE — Progress Notes (Unsigned)
 Follow up left long finger;  Injury 4 weeks

## 2024-06-03 ENCOUNTER — Ambulatory Visit: Admitting: Orthopedic Surgery

## 2024-06-03 ENCOUNTER — Other Ambulatory Visit: Payer: Self-pay

## 2024-06-03 DIAGNOSIS — S62639A Displaced fracture of distal phalanx of unspecified finger, initial encounter for closed fracture: Secondary | ICD-10-CM

## 2024-06-03 DIAGNOSIS — S62633A Displaced fracture of distal phalanx of left middle finger, initial encounter for closed fracture: Secondary | ICD-10-CM

## 2024-06-05 NOTE — Progress Notes (Signed)
 "    06/06/2024 Jordan Anderson [id number] 11-29-1960  Referring provider: Kennyth Worth HERO, MD Primary GI doctor: Dr. San  ASSESSMENT AND PLAN:  Change in bowel habits Increased stress with her mother and husband passing this past year Chronic alteration in bowel habits with increased frequency and urgency, likely multifactorial etiology including stress and diet. Organic pathology less likely but requires evaluation. - Educated on increasing dietary fiber intake. - Provided FODMAP diet handout. - Recommended lactose avoidance. - Discussed optional probiotic trial for 2-4 weeks. - Scheduled colonoscopy for June 18, 2024. - Advised to report if symptoms worsen; conditional CT scan if colonoscopy is normal and symptoms persist. - Discussed stress and dietary changes impact, reassured regarding improvement.  Personal history of colon polyps 10/12/2018 colonoscopy with Dr. San adequate prep 4 mm tubular adenomatous polyp cecum, sigmoid colon moderately tortuous, internal hemorrhoids normal TI Recall colonoscopy 5 years We have discussed the risks of bleeding, infection, perforation, medication reactions, and remote risk of death associated with colonoscopy. All questions were answered and the patient acknowledges these risk and wishes to proceed.   Patient Care Team: Kennyth Worth HERO, MD as PCP - General (Family Medicine) Livingston Rigg, MD as Consulting Physician (Dermatology)  HISTORY OF PRESENT ILLNESS: 64 y.o. female with a past medical history listed below presents for evaluation of change in bowel habits.   Discussed the use of AI scribe software for clinical note transcription with the patient, who gave verbal consent to proceed.  History of Present Illness   Jordan Anderson is a 64 year old female with internal hemorrhoids, tortuous colon, and prior colonic polyps who presents for evaluation of changes in bowel habits.  Colonoscopy in 2020  confirmed internal hemorrhoids, tortuous colon, and colonic polyps. Multiple colonoscopies have been performed due to a family history of polyps, and she is currently due for repeat surveillance.  Over the past year, bowel habits have changed with increased frequency of bowel movements, typically occurring within an hour after meals. Stools were previously loose and diarrhea-like, but are now more formed, described as long, thin, and light brown. No issues with constipation, incomplete evacuation, or oily stools. Occasional blood is noted on tissue with wiping, but no blood is seen in the stool. Increased gas is present, and prior intermittent gurgling and bloating have improved. Denies abdominal pain, heartburn, nausea, vomiting, fevers, chills, or bloating at present.  Appetite has decreased and mild weight loss occurred over the past year, which she attributes to significant psychosocial stressors. Weight dropped to 103 lbs in March and has since increased to 106-107 lbs. Symptoms are gradually improving.  Dietary modifications include avoidance of lactose-containing foods, as milk and ice cream previously caused malodorous stools, while yogurt and cheese are tolerated. Recent increase in fiber intake may have contributed to improved stool form. Two cups of coffee are consumed in the morning, previously prompting a single bowel movement, but now additional bowel movements occur after lunch and other meals. No new supplements or medications have been started recently, except for intermittent use of proleukin for a finger injury, which she is not currently taking. She received a course of antibiotics for strep throat in March.      She  reports that she has never smoked. She has never used smokeless tobacco. She reports current alcohol use of about 10.0 standard drinks of alcohol per week. She reports that she does not use drugs.  RELEVANT GI HISTORY, IMAGING AND LABS: Results   Diagnostic Colonoscopy  (  2020): Internal hemorrhoids, tortuous colon, colonic polyps      CBC    Component Value Date/Time   WBC 5.5 07/26/2022 1404   RBC 4.29 07/26/2022 1404   HGB 13.5 07/26/2022 1404   HCT 40.3 07/26/2022 1404   PLT 259.0 07/26/2022 1404   MCV 94.0 07/26/2022 1404   MCHC 33.5 07/26/2022 1404   RDW 14.3 07/26/2022 1404   LYMPHSABS 1.3 04/03/2017 1137   MONOABS 0.3 04/03/2017 1137   EOSABS 0.1 04/03/2017 1137   BASOSABS 0.0 04/03/2017 1137   No results for input(s): HGB in the last 8760 hours.  CMP     Component Value Date/Time   NA 142 07/26/2022 1404   K 4.4 07/26/2022 1404   CL 102 07/26/2022 1404   CO2 30 07/26/2022 1404   GLUCOSE 95 07/26/2022 1404   BUN 16 07/26/2022 1404   CREATININE 0.73 07/26/2022 1404   CALCIUM 10.3 07/26/2022 1404   PROT 7.0 07/26/2022 1404   ALBUMIN 4.5 07/26/2022 1404   AST 18 07/26/2022 1404   ALT 13 07/26/2022 1404   ALKPHOS 42 07/26/2022 1404   BILITOT 0.6 07/26/2022 1404   GFRNONAA 80.99 04/29/2009 0823   GFRAA 99 03/14/2008 0936      Latest Ref Rng & Units 07/26/2022    2:04 PM 07/22/2021    2:52 PM 10/07/2019    1:59 PM  Hepatic Function  Total Protein 6.0 - 8.3 g/dL 7.0  7.4  7.1   Albumin 3.5 - 5.2 g/dL 4.5  4.9  5.0   AST 0 - 37 U/L 18  19  21    ALT 0 - 35 U/L 13  13  18    Alk Phosphatase 39 - 117 U/L 42  39  47   Total Bilirubin 0.2 - 1.2 mg/dL 0.6  0.9  0.9       Current Medications:   Current Outpatient Medications (Endocrine & Metabolic):    levothyroxine  (SYNTHROID ) 75 MCG tablet, TAKE 1 TABLET BY MOUTH EVERY DAY  Current Outpatient Medications (Cardiovascular):    propranolol  (INDERAL ) 10 MG tablet, Take 1 tablet (10 mg total) by mouth every 8 (eight) hours as needed.  Current Outpatient Medications (Respiratory):    azelastine  (ASTELIN ) 0.1 % nasal spray, Place 2 sprays into both nostrils 2 (two) times daily. (Patient taking differently: Place 2 sprays into both nostrils 2 (two) times daily as needed.)    fluticasone  (FLONASE ) 50 MCG/ACT nasal spray, Place 2 sprays into both nostrils daily. (Patient taking differently: Place 2 sprays into both nostrils daily as needed.)   montelukast  (SINGULAIR ) 10 MG tablet, TAKE 1 TABLET BY MOUTH EVERYDAY AT BEDTIME (Patient taking differently: Take 10 mg by mouth as needed.)  Current Outpatient Medications (Analgesics):    meloxicam  (MOBIC ) 15 MG tablet, TAKE 1 TABLET BY MOUTH DAILY. (Patient taking differently: Take 15 mg by mouth as needed.)  Current Outpatient Medications (Other):    fluocinonide  (LIDEX ) 0.05 % external solution, Apply 1 Application topically 2 (two) times daily. (Patient taking differently: Apply 1 Application topically 2 (two) times daily as needed.)   Na Sulfate-K Sulfate-Mg Sulfate concentrate (SUPREP BOWEL PREP KIT) 17.5-3.13-1.6 GM/177ML SOLN, Take 1 kit (354 mLs total) by mouth once for 1 dose.  Medical History:  Past Medical History:  Diagnosis Date   Allergy    Cataract    Hypothyroidism    Seasonal allergies    SVT (supraventricular tachycardia)    Allergies: Allergies[1]   Surgical History:  She  has a past  surgical history that includes Vein ligation (Left, 2012) and ablation uterus (2005). Family History:  Her family history includes Breast cancer in her mother; Cancer in her father; Prostate cancer in her father.  REVIEW OF SYSTEMS  : All other systems reviewed and negative except where noted in the History of Present Illness.  PHYSICAL EXAM: BP 102/78   Pulse 73   Ht 5' 5.5 (1.664 m)   Wt 106 lb 8 oz (48.3 kg)   BMI 17.45 kg/m  Physical Exam   MEASUREMENTS: Weight- 106. GENERAL APPEARANCE: Well nourished, in no apparent distress. HEENT: No cervical lymphadenopathy, unremarkable thyroid , sclerae anicteric, conjunctiva pink. RESPIRATORY: Respiratory effort normal, BS equal bilateral without rales, rhonchi, wheezing. CARDIO: RRR with no MRGs, peripheral pulses intact. ABDOMEN: Soft, non distended, active  bowel sounds in all 4 quadrants, no tenderness to palpation, no rebound, no masses appreciated. RECTAL: Declines. MUSCULOSKELETAL: Full ROM, normal gait, without edema. SKIN: Dry, intact without rashes or lesions. No jaundice. NEURO: Alert, oriented, no focal deficits. PSYCH: Cooperative, normal mood and affect.      Alan JONELLE Coombs, PA-C 11:40 AM      [1] No Known Allergies  "

## 2024-06-06 ENCOUNTER — Ambulatory Visit: Admitting: Physician Assistant

## 2024-06-06 ENCOUNTER — Encounter: Payer: Self-pay | Admitting: Physician Assistant

## 2024-06-06 VITALS — BP 102/78 | HR 73 | Ht 65.5 in | Wt 106.5 lb

## 2024-06-06 DIAGNOSIS — Z8719 Personal history of other diseases of the digestive system: Secondary | ICD-10-CM | POA: Diagnosis not present

## 2024-06-06 DIAGNOSIS — Z860101 Personal history of adenomatous and serrated colon polyps: Secondary | ICD-10-CM | POA: Diagnosis not present

## 2024-06-06 DIAGNOSIS — R194 Change in bowel habit: Secondary | ICD-10-CM | POA: Diagnosis not present

## 2024-06-06 MED ORDER — NA SULFATE-K SULFATE-MG SULF 17.5-3.13-1.6 GM/177ML PO SOLN: 1.0000 | Freq: Once | ORAL | 0 refills | Status: AC

## 2024-06-06 NOTE — Patient Instructions (Addendum)
 First do a trial off milk/lactose products if you use them.  Add fiber like benefiber or citracel once a day Can do trial of probiotic for 1 month to see if this helps    FODMAP stands for fermentable oligo-, di-, mono-saccharides and polyols (1). These are the scientific terms used to classify groups of carbs that are difficult for our body to digest and that are notorious for triggering digestive symptoms like bloating, gas, loose stools and stomach pain.   You can try low FODMAP diet  - start with eliminating just one column at a time that you feel may be a trigger for you. - the table at the very bottom contains foods that are low in FODMAPs   Sometimes trying to eliminate the FODMAP's from your diet is difficult or tricky, if you are stuggling with trying to do the elimination diet you can try an enzyme.  There is a food enzymes that you sprinkle in or on your food that helps break down the FODMAP. You can read more about the enzyme by going to this site: https://fodzyme.com/   You have been scheduled for a colonoscopy. Please follow written instructions given to you at your visit today.   If you use inhalers (even only as needed), please bring them with you on the day of your procedure.  DO NOT TAKE 7 DAYS PRIOR TO TEST- Trulicity (dulaglutide) Ozempic, Wegovy (semaglutide) Mounjaro, Zepbound (tirzepatide) Bydureon Bcise (exanatide extended release)  DO NOT TAKE 1 DAY PRIOR TO YOUR TEST Rybelsus (semaglutide) Adlyxin (lixisenatide) Victoza (liraglutide) Byetta (exanatide) ___________________________________________________________________________    Due to recent changes in healthcare laws, you may see the results of your imaging and laboratory studies on MyChart before your provider has had a chance to review them.  We understand that in some cases there may be results that are confusing or concerning to you. Not all laboratory results come back in the same time frame and  the provider may be waiting for multiple results in order to interpret others.  Please give us  48 hours in order for your provider to thoroughly review all the results before contacting the office for clarification of your results.    I appreciate the  opportunity to care for you  Thank You   [facility]

## 2024-06-07 NOTE — Therapy (Signed)
 " OUTPATIENT OCCUPATIONAL THERAPY ORTHO EVALUATION  Patient Name: Jordan Anderson MRN: [id number] DOB:11-18-1960, 64 y.o., female Today's Date: 06/10/2024  PCP: Kennyth MOTE MD REFERRING PROVIDER:  Arlinda Buster, MD    END OF SESSION:  OT End of Session - 06/10/24 0931     Visit Number 1    Number of Visits 5    Date for Recertification  07/26/24    Authorization Type [employer]    OT Start Time 0932    OT Stop Time 1002    OT Time Calculation (min) 30 min    Equipment Utilized During Treatment digital gel cap    Activity Tolerance Patient tolerated treatment well;No increased pain;Patient limited by fatigue;Patient limited by pain    Behavior During Therapy [facility] for tasks assessed/performed          Past Medical History:  Diagnosis Date   Allergy    Cataract    Hypothyroidism    Seasonal allergies    SVT (supraventricular tachycardia)    Past Surgical History:  Procedure Laterality Date   ablation uterus  2005   VEIN LIGATION Left 2012   Patient Active Problem List   Diagnosis Date Noted   Underweight 07/26/2022   Dyslipidemia 07/23/2021   Palpitations 07/22/2021   Osteopenia after menopause 02/13/2020   Eczema 06/25/2018   History of colonic polyps 05/08/2009   VARICOSE VEINS, LOWER EXTREMITIES 03/21/2008   Hypothyroidism 01/23/2007   Allergic rhinitis 01/23/2007    ONSET DATE: 05/05/24 DOI  REFERRING DIAG: D37.360J (ICD-10-CM) - Closed avulsion fracture of distal phalanx of finger, initial encounter   THERAPY DIAG:  Localized edema  Stiffness of left hand, not elsewhere classified  Muscle weakness (generalized)  Pain in left hand  Rationale for Evaluation and Treatment: Rehabilitation  SUBJECTIVE:   SUBJECTIVE STATEMENT: She is now 5 weeks s/p Lt MF DIP J dislocation and volar fx to P3 (FDP).  She states she fell, dislocating her left middle finger tip joint.  She has been conservatively healing in a stacks splint.  She does not have any  pain but her finger is slightly swollen and stiff Dr. Roz motion at this point and weaning from her brace for light tasks.  She would like to return to tennis ASAP     PERTINENT HISTORY: None other related to this  PRECAUTIONS: None  RED FLAGS: None   WEIGHT BEARING RESTRICTIONS: Yes no weightbearing more than 1 or 2 pounds for the next 2 to 3 weeks  PAIN:  Are you having pain?  None now or significantly in the past week  FALLS: Has patient fallen in last 6 months? Yes. Number of falls 1-this accident, not considered a fall risk  PLOF: Independent  PATIENT GOALS: To safely improve the mobility and strength of her left hand  NEXT MD VISIT: As needed  OBJECTIVE: (All objective assessments below are from initial evaluation on: 06/10/24 unless otherwise specified.)   HAND DOMINANCE: Right   ADLs: Overall ADLs: States only having problems with heavy weightbearing activities or dynamic sports like tennis.  Basic ADLs are no problem for her at this point  FUNCTIONAL OUTCOME MEASURES: Eval: Patient Specific Functional Scale: 0 (tenderness, weight lifting, dynamic sports)  (Higher Score  =  Better Ability for the Selected Tasks)     UPPER EXTREMITY ROM      Hand AROM Left eval  Full Fist Ability (or Gap to Distal Palmar Crease) Middle finger stands up from distal palmar crease slightly, otherwise full fist  Thumb Opposition  (Kapandji Scale)  Within normal limits  Thumb MCP (0-60)   Thumb IP (0-80)   Thumb Radial Abduction Span    Thumb Palmar Abduction Span    Index MCP (0-90)    Index PIP (0-100)    Index DIP (0-70)    Long MCP (0-90) 0- 70   Long PIP (0-100)  0- 103  Long DIP (0-70) 0-  37  Ring MCP (0-90)    Ring PIP (0-100)    Ring DIP (0-70)    Little MCP (0-90)    Little PIP (0-100)    Little DIP (0-70)    (Blank rows = not tested)   HAND FUNCTION: Eval: Observed weakness in affected Lt hand.  TBD when safe Grip strength Right: TBD lbs, Left: TBD lbs    COORDINATION: Eval:  no significant coordination impairments with affected left hand. 9 Hole Peg Test Left: 22 sec (25 sec is WFL, 20sec is AVG)   SENSATION: Eval:  Light touch intact today, no complaints  EDEMA:   Eval:  Mildly swollen in left hand today, compared to the right  COGNITION: Eval: Overall cognitive status: WFL for evaluation today   OBSERVATIONS:   Eval: Mildly red mildly swollen, stiff.  Not overtly tender to palpation, tolerates passive stretch well   TODAY'S TREATMENT:  Post-evaluation treatment:   For safety/self-care she was recommended to do no significant weightbearing for the next 2 to 3 weeks.  She was recommended to do light functional activities as tolerated to promote lateral motion, she was asked to try spinning a pen for rotation.  She was also supplied with a compressive, nonslip digital gel cap for some support as needed, but she should wean out of all braces for light daily activities as tolerated.   She was given the following home exercise program to perform 3-4 times a day, in a nonpainful fashion, and each 1 was shown to her and perform with her to ensure understanding.  Exercises - BACK KNUCKLE STRETCHES   - 4 x daily - 3-5 reps - 15 sec hold - HOOK Stretch  - 4 x daily - 3-5 reps - 15-20 sec hold - Seated Finger Composite Flexion Stretch  - 4 x daily - 3-5 reps - 15 hold - Tip Joint Blocking Motion  - 4-6 x daily - 10-15 reps - Tendon Glides  - 4-6 x daily - 3-5 reps - 2-3 seconds hold   PATIENT EDUCATION: Education details: See tx section above for details  Person educated: Patient Education method: Verbal Instruction, Teach back, Handouts  Education comprehension: States and demonstrates understanding, Additional Education required    HOME EXERCISE PROGRAM: Access Code: [email or web address] URL: [email or web address] Date: 06/10/2024 Prepared by: Melvenia Ada   GOALS: Goals reviewed with patient? Yes   SHORT TERM  GOALS: (STG required if POC>30 days) Target Date: 06/10/2024   Pt will demo/state understanding of initial HEP to improve pain levels and prerequisite motion. Goal status: Goal met   LONG TERM GOALS: Target Date: 07/26/2024  Pt will improve functional ability by decreased impairment per PSFS assessment from 0 to 6 or better, for better quality of life. Goal status: INITIAL  2.  Pt will improve grip strength in left hand from unsafe to test to at least 30 lbs for functional use at home and in IADLs. Goal status: INITIAL  3.  Pt will improve A/ROM in left middle finger total active motion from 210 degrees to at least 230 degrees, to have functional  motion for tasks like reach and grasp.  Goal status: INITIAL    ASSESSMENT:  CLINICAL IMPRESSION: Patient is a 64 y.o. female who was seen today for occupational therapy evaluation for stiffness, swelling, weakness and left nondominant hand after avulsion fracture at the DIP joint.  The patient will benefit from outpatient occupational therapy to decrease symptoms, improve functional upper extremity use, and increase quality of life.  PERFORMANCE DEFICITS: in functional skills including IADLs, ROM, strength, pain, fascial restrictions, decreased knowledge of precautions, and UE functional use, cognitive skills including problem solving and safety awareness, and psychosocial skills including coping strategies, environmental adaptation, habits, and routines and behaviors.   IMPAIRMENTS: are limiting patient from ADLs, IADLs, rest and sleep, and leisure.   COMORBIDITIES: has no other co-morbidities that affects occupational performance. Patient will benefit from skilled OT to address above impairments and improve overall function.  MODIFICATION OR ASSISTANCE TO COMPLETE EVALUATION: No modification of tasks or assist necessary to complete an evaluation.  OT OCCUPATIONAL PROFILE AND HISTORY: Problem focused assessment: Including review of records  relating to presenting problem.  CLINICAL DECISION MAKING: LOW - limited treatment options, no task modification necessary  REHAB POTENTIAL: Excellent  EVALUATION COMPLEXITY: Low      PLAN:  OT FREQUENCY: 1-2x/week  OT DURATION: 6 weeks through 07/26/2024 and up to 5 total visits as needed   PLANNED INTERVENTIONS: 97535 self care/ADL training, 02889 therapeutic exercise, 97530 therapeutic activity, 97112 neuromuscular re-education, 97140 manual therapy, 97035 ultrasound, 97032 electrical stimulation (manual), 97760 Orthotic Initial, [id number] Orthotic/Prosthetic subsequent, compression bandaging, Dry needling, energy conservation, coping strategies training, and patient/family education  RECOMMENDED OTHER SERVICES: none now    CONSULTED AND AGREED WITH PLAN OF CARE: Patient  PLAN FOR NEXT SESSION:   Review initial HEP and recommendations, add therapy putty activities for grip and pinch training as tolerated in 2 weeks   Melvenia Ada, OTR/L, CHT  06/10/2024, 10:19 AM   "

## 2024-06-10 ENCOUNTER — Ambulatory Visit: Admitting: Rehabilitative and Restorative Service Providers"

## 2024-06-10 ENCOUNTER — Encounter: Payer: Self-pay | Admitting: Rehabilitative and Restorative Service Providers"

## 2024-06-10 DIAGNOSIS — M6281 Muscle weakness (generalized): Secondary | ICD-10-CM | POA: Diagnosis not present

## 2024-06-10 DIAGNOSIS — M25642 Stiffness of left hand, not elsewhere classified: Secondary | ICD-10-CM | POA: Diagnosis not present

## 2024-06-10 DIAGNOSIS — R600 Localized edema: Secondary | ICD-10-CM | POA: Diagnosis not present

## 2024-06-10 DIAGNOSIS — M79642 Pain in left hand: Secondary | ICD-10-CM | POA: Diagnosis not present

## 2024-06-12 ENCOUNTER — Encounter: Payer: Self-pay | Admitting: Gastroenterology

## 2024-06-18 ENCOUNTER — Encounter: Admitting: Gastroenterology

## 2024-06-20 NOTE — Therapy (Incomplete)
 " OUTPATIENT OCCUPATIONAL THERAPY TREATMENT NOTE  Patient Name: Jordan Anderson MRN: [id number] DOB:10/12/1960, 64 y.o., female Today's Date: 06/20/2024  PCP: Kennyth MOTE MD REFERRING PROVIDER:  Arlinda Buster, MD    END OF SESSION:    Past Medical History:  Diagnosis Date   Allergy    Cataract    Hypothyroidism    Seasonal allergies    SVT (supraventricular tachycardia)    Past Surgical History:  Procedure Laterality Date   ablation uterus  2005   VEIN LIGATION Left 2012   Patient Active Problem List   Diagnosis Date Noted   Underweight 07/26/2022   Dyslipidemia 07/23/2021   Palpitations 07/22/2021   Osteopenia after menopause 02/13/2020   Eczema 06/25/2018   History of colonic polyps 05/08/2009   VARICOSE VEINS, LOWER EXTREMITIES 03/21/2008   Hypothyroidism 01/23/2007   Allergic rhinitis 01/23/2007    ONSET DATE: 05/05/24 DOI  REFERRING DIAG: D37.360J (ICD-10-CM) - Closed avulsion fracture of distal phalanx of finger, initial encounter   THERAPY DIAG:  No diagnosis found.  Rationale for Evaluation and Treatment: Rehabilitation  PERTINENT HISTORY: None other related to this She states she fell, dislocating her left middle finger tip joint.  She has been conservatively healing in a stacks splint.  She does not have any pain but her finger is slightly swollen and stiff Dr. Roz motion at this point and weaning from her brace for light tasks.  She would like to return to tennis ASAP  PRECAUTIONS: None  RED FLAGS: None   WEIGHT BEARING RESTRICTIONS: Yes no weightbearing more than 1 or 2 pounds for the next 2 to 3 weeks   SUBJECTIVE:   SUBJECTIVE STATEMENT: She is now 7 weeks s/p Lt MF DIP J dislocation and volar fx to P3 (FDP).   She returns after 2 weeks of self-management, states ***.       PAIN:  Are you having pain?  None now or significantly in the past week  FALLS: Has patient fallen in last 6 months? Yes. Number of falls 1-this  accident, not considered a fall risk  PLOF: Independent  PATIENT GOALS: To safely improve the mobility and strength of her left hand  NEXT MD VISIT: As needed  OBJECTIVE: (All objective assessments below are from initial evaluation on: 06/10/24 unless otherwise specified.)   HAND DOMINANCE: Right   ADLs: Overall ADLs: States only having problems with heavy weightbearing activities or dynamic sports like tennis.  Basic ADLs are no problem for her at this point  FUNCTIONAL OUTCOME MEASURES: Eval: Patient Specific Functional Scale: 0 (tenderness, weight lifting, dynamic sports)  (Higher Score  =  Better Ability for the Selected Tasks)     UPPER EXTREMITY ROM      Hand AROM Left eval Lt 06/24/24  Full Fist Ability (or Gap to Distal Palmar Crease) Middle finger stands up from distal palmar crease slightly, otherwise full fist   Thumb Opposition  (Kapandji Scale)  Within normal limits   Thumb MCP (0-60)    Thumb IP (0-80)    Thumb Radial Abduction Span     Thumb Palmar Abduction Span     Index MCP (0-90)     Index PIP (0-100)     Index DIP (0-70)     Long MCP (0-90) 0- 70  0 - ***  Long PIP (0-100)  0- 103 0 - ***  Long DIP (0-70) 0-  37 0 - ***  Ring MCP (0-90)     Ring PIP (0-100)  Ring DIP (0-70)     Little MCP (0-90)     Little PIP (0-100)     Little DIP (0-70)     (Blank rows = not tested)   HAND FUNCTION: 06/24/24: Grip strength Right: *** lbs, Left: *** lbs   COORDINATION: Eval:  no significant coordination impairments with affected left hand. 9 Hole Peg Test Left: 22 sec (25 sec is WFL, 20sec is AVG)   SENSATION: Eval:  Light touch intact today, no complaints  EDEMA:   Eval:  Mildly swollen in left hand today, compared to the right  COGNITION: Eval: Overall cognitive status: WFL for evaluation today   OBSERVATIONS:   Eval: Mildly red mildly swollen, stiff.  Not overtly tender to palpation, tolerates passive stretch well   TODAY'S TREATMENT:  06/24/24:   *** Review initial HEP and recommendations, add therapy putty activities for grip and pinch training as tolerated in 2 weeks    Post-evaluation treatment:   For safety/self-care she was recommended to do no significant weightbearing for the next 2 to 3 weeks.  She was recommended to do light functional activities as tolerated to promote lateral motion, she was asked to try spinning a pen for rotation.  She was also supplied with a compressive, nonslip digital gel cap for some support as needed, but she should wean out of all braces for light daily activities as tolerated.   She was given the following home exercise program to perform 3-4 times a day, in a nonpainful fashion, and each 1 was shown to her and perform with her to ensure understanding.  Exercises - BACK KNUCKLE STRETCHES   - 4 x daily - 3-5 reps - 15 sec hold - HOOK Stretch  - 4 x daily - 3-5 reps - 15-20 sec hold - Seated Finger Composite Flexion Stretch  - 4 x daily - 3-5 reps - 15 hold - Tip Joint Blocking Motion  - 4-6 x daily - 10-15 reps - Tendon Glides  - 4-6 x daily - 3-5 reps - 2-3 seconds hold   PATIENT EDUCATION: Education details: See tx section above for details  Person educated: Patient Education method: Verbal Instruction, Teach back, Handouts  Education comprehension: States and demonstrates understanding, Additional Education required    HOME EXERCISE PROGRAM: Access Code: [email or web address] URL: [email or web address] Date: 06/10/2024 Prepared by: Melvenia Ada   GOALS: Goals reviewed with patient? Yes   SHORT TERM GOALS: (STG required if POC>30 days) Target Date: 06/10/2024   Pt will demo/state understanding of initial HEP to improve pain levels and prerequisite motion. Goal status: Goal met   LONG TERM GOALS: Target Date: 07/26/2024  Pt will improve functional ability by decreased impairment per PSFS assessment from 0 to 6 or better, for better quality of life. Goal status:  INITIAL  2.  Pt will improve grip strength in left hand from unsafe to test to at least 30 lbs for functional use at home and in IADLs. Goal status: INITIAL  3.  Pt will improve A/ROM in left middle finger total active motion from 210 degrees to at least 230 degrees, to have functional motion for tasks like reach and grasp.  Goal status: INITIAL    ASSESSMENT:  CLINICAL IMPRESSION: 06/24/24: ***   Eval: Patient is a 64 y.o. female who was seen today for occupational therapy evaluation for stiffness, swelling, weakness and left nondominant hand after avulsion fracture at the DIP joint.  The patient will benefit from outpatient occupational therapy to decrease symptoms, improve functional  upper extremity use, and increase quality of life.   PLAN:  OT FREQUENCY: 1-2x/week  OT DURATION: 6 weeks through 07/26/2024 and up to 5 total visits as needed   PLANNED INTERVENTIONS: 97535 self care/ADL training, 02889 therapeutic exercise, 97530 therapeutic activity, 97112 neuromuscular re-education, 97140 manual therapy, 97035 ultrasound, 97032 electrical stimulation (manual), 97760 Orthotic Initial, [id number] Orthotic/Prosthetic subsequent, compression bandaging, Dry needling, energy conservation, coping strategies training, and patient/family education  RECOMMENDED OTHER SERVICES: none now    CONSULTED AND AGREED WITH PLAN OF CARE: Patient  PLAN FOR NEXT SESSION:   ***  Melvenia Ada, OTR/L, CHT  06/20/2024, 4:56 PM   "

## 2024-06-24 ENCOUNTER — Encounter: Admitting: Rehabilitative and Restorative Service Providers"

## 2024-06-26 ENCOUNTER — Encounter: Payer: Self-pay | Admitting: Rehabilitative and Restorative Service Providers"

## 2024-06-26 ENCOUNTER — Ambulatory Visit: Admitting: Rehabilitative and Restorative Service Providers"

## 2024-06-26 DIAGNOSIS — M79642 Pain in left hand: Secondary | ICD-10-CM | POA: Diagnosis not present

## 2024-06-26 DIAGNOSIS — M6281 Muscle weakness (generalized): Secondary | ICD-10-CM | POA: Diagnosis not present

## 2024-06-26 DIAGNOSIS — M25642 Stiffness of left hand, not elsewhere classified: Secondary | ICD-10-CM

## 2024-06-26 DIAGNOSIS — R600 Localized edema: Secondary | ICD-10-CM | POA: Diagnosis not present

## 2024-07-01 ENCOUNTER — Encounter: Admitting: Rehabilitative and Restorative Service Providers"

## 2024-07-10 ENCOUNTER — Encounter: Admitting: Gastroenterology

## 2024-07-12 ENCOUNTER — Encounter: Admitting: Gastroenterology

## 2024-07-16 ENCOUNTER — Encounter: Admitting: Gastroenterology

## 2024-07-31 ENCOUNTER — Encounter: Admitting: Gastroenterology
